# Patient Record
Sex: Female | Born: 1965 | Race: Black or African American | Hispanic: Yes | Marital: Single | Smoking: Never smoker
Health system: Southern US, Community
[De-identification: ages and names within clinical notes are randomized; demographics above are authoritative.]

## PROBLEM LIST (undated history)

## (undated) DIAGNOSIS — T7840XA Allergy, unspecified, initial encounter: Secondary | ICD-10-CM

## (undated) DIAGNOSIS — D649 Anemia, unspecified: Secondary | ICD-10-CM

## (undated) DIAGNOSIS — R0600 Dyspnea, unspecified: Secondary | ICD-10-CM

## (undated) DIAGNOSIS — R5383 Other fatigue: Secondary | ICD-10-CM

## (undated) DIAGNOSIS — N83209 Unspecified ovarian cyst, unspecified side: Secondary | ICD-10-CM

## (undated) DIAGNOSIS — I10 Essential (primary) hypertension: Secondary | ICD-10-CM

## (undated) DIAGNOSIS — R079 Chest pain, unspecified: Secondary | ICD-10-CM

## (undated) HISTORY — PX: TUBAL LIGATION: SHX77

## (undated) HISTORY — DX: Anemia, unspecified: D64.9

## (undated) HISTORY — PX: ABLATION: SHX5711

## (undated) HISTORY — DX: Chest pain, unspecified: R07.9

## (undated) HISTORY — DX: Unspecified ovarian cyst, unspecified side: N83.209

## (undated) HISTORY — DX: Other fatigue: R53.83

## (undated) HISTORY — DX: Allergy, unspecified, initial encounter: T78.40XA

## (undated) HISTORY — PX: TONSILLECTOMY: SUR1361

## (undated) HISTORY — DX: Dyspnea, unspecified: R06.00

---

## 2007-02-04 ENCOUNTER — Other Ambulatory Visit: Admission: RE | Admit: 2007-02-04 | Discharge: 2007-02-04 | Payer: Self-pay | Admitting: Obstetrics and Gynecology

## 2007-06-18 ENCOUNTER — Inpatient Hospital Stay (HOSPITAL_COMMUNITY): Admission: AD | Admit: 2007-06-18 | Discharge: 2007-06-18 | Payer: Self-pay | Admitting: Obstetrics and Gynecology

## 2007-11-09 ENCOUNTER — Ambulatory Visit (HOSPITAL_COMMUNITY): Admission: RE | Admit: 2007-11-09 | Discharge: 2007-11-09 | Payer: Self-pay | Admitting: Obstetrics and Gynecology

## 2009-11-15 ENCOUNTER — Emergency Department (HOSPITAL_COMMUNITY): Admission: EM | Admit: 2009-11-15 | Discharge: 2009-11-15 | Payer: Self-pay | Admitting: Emergency Medicine

## 2010-04-17 LAB — DIFFERENTIAL
Basophils Absolute: 0 10*3/uL (ref 0.0–0.1)
Basophils Relative: 0 % (ref 0–1)
Eosinophils Absolute: 0.3 10*3/uL (ref 0.0–0.7)
Eosinophils Relative: 2 % (ref 0–5)
Lymphocytes Relative: 10 % — ABNORMAL LOW (ref 12–46)
Lymphs Abs: 1.3 10*3/uL (ref 0.7–4.0)
Monocytes Absolute: 0.5 10*3/uL (ref 0.1–1.0)
Monocytes Relative: 4 % (ref 3–12)
Neutro Abs: 11.7 10*3/uL — ABNORMAL HIGH (ref 1.7–7.7)
Neutrophils Relative %: 84 % — ABNORMAL HIGH (ref 43–77)

## 2010-04-17 LAB — URINALYSIS, ROUTINE W REFLEX MICROSCOPIC
Bilirubin Urine: NEGATIVE
Glucose, UA: 250 mg/dL — AB
Leukocytes, UA: NEGATIVE
Nitrite: NEGATIVE
Protein, ur: NEGATIVE mg/dL
Specific Gravity, Urine: 1.017 (ref 1.005–1.030)
Urobilinogen, UA: 0.2 mg/dL (ref 0.0–1.0)
pH: 7.5 (ref 5.0–8.0)

## 2010-04-17 LAB — CBC
HCT: 39 % (ref 36.0–46.0)
Hemoglobin: 12.7 g/dL (ref 12.0–15.0)
MCH: 23.8 pg — ABNORMAL LOW (ref 26.0–34.0)
MCHC: 32.6 g/dL (ref 30.0–36.0)
MCV: 72.9 fL — ABNORMAL LOW (ref 78.0–100.0)
Platelets: 310 10*3/uL (ref 150–400)
RBC: 5.34 MIL/uL — ABNORMAL HIGH (ref 3.87–5.11)
RDW: 16.7 % — ABNORMAL HIGH (ref 11.5–15.5)
WBC: 13.9 10*3/uL — ABNORMAL HIGH (ref 4.0–10.5)

## 2010-04-17 LAB — GC/CHLAMYDIA PROBE AMP, GENITAL
Chlamydia, DNA Probe: NEGATIVE
GC Probe Amp, Genital: NEGATIVE

## 2010-04-17 LAB — WET PREP, GENITAL
Clue Cells Wet Prep HPF POC: NONE SEEN
Trich, Wet Prep: NONE SEEN
WBC, Wet Prep HPF POC: NONE SEEN
Yeast Wet Prep HPF POC: NONE SEEN

## 2010-04-17 LAB — URINE MICROSCOPIC-ADD ON

## 2010-04-17 LAB — PREGNANCY, URINE: Preg Test, Ur: NEGATIVE
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
## 2010-06-17 NOTE — Op Note (Signed)
Robin Delacruz, Robin Delacruz          ACCOUNT NO.:  [id number]   MEDICAL RECORD NO.:  [id number]          PATIENT TYPE:  AMB   LOCATION:  SDC                           FACILITY:  [facility]   PHYSICIAN:  Gerald Leitz, MD          DATE OF BIRTH:  [birth year]   DATE OF PROCEDURE:  11/09/2007  DATE OF DISCHARGE:                               OPERATIVE REPORT   PREOPERATIVE DIAGNOSES:  1. Menorrhagia.  2. Irregular menses.  3. Desires permanent sterilization.   POSTOPERATIVE DIAGNOSES:  1. Menorrhagia.  2. Irregular menses.  3. Desires permanent sterilization.   PROCEDURES:  NovaSure ablation and laparoscopic bilateral tubal ligation  with Filshie clips.   SURGEON:  Gerald Leitz, MD   ASSISTANT:  None.   ESTIMATED BLOOD LOSS:  Minimal.   SPECIMEN:  None.   FINDINGS:  Adhesions of the omentum to the anterior abdominal wall.   FLUIDS:  Per anesthesia.   PROCEDURE:  The patient was taken to the operating room where she was  placed under general anesthesia.  She was placed in the dorsal lithotomy  position, prepped and draped in the usual sterile fashion.  Speculum was  placed in the vaginal vault.  The anterior lip of cervix was grasped  with a single-tooth tenaculum.  The uterus was then sounded to 8.5 cm.  Endocervical canal length was 4.5 cm.  The wound cervix was dilated to 8  mm and the NovaSure apparatus was set at the cavity length of 4 cm.  It  was advanced into the endometrial canal and the array was deployed.  Cavity width was 2.7 cm.  The array of NovaSure device was seated.  CO2  integrity test was performed and this was successfully passed.  Ablation  was performed at a power of 59 W at its full time of 117 seconds.  The  array was allowed to cool.  It was then withdrawn from the endometrial  cavity.  The uterine manipulator was then placed and a single-tooth  tenaculum was removed.  Sterile gloves were changed and the attention  was then turned to the abdomen where a 5-mm skin  incision was made with  the scalpel and a 5-mm trocar was placed under direct visualization.  The abdomen was insufflated with CO2 gas.  The abdomen and pelvis was  examined with the findings noted above.  The patient had adhesions of  the omentum to the anterior abdominal wall.  I was able to visualize the  uterus by going around the adhesions; however, due to the adhesions,  placement of the second trocar was not performed.  Decision was made to  use the umbilicus as the operative port.  Therefore, the 5-mm trocar and  scope were removed.  The umbilical skin incision was extended to 10 mm  and a 10-mm trocar was then placed under direct visualization.  The  operative scope was inserted and the Filshie clip was placed over the  right fallopian tube.  Proper placement was assured.  This was repeated  on the left fallopian tube.  Placement was verified.  CO2 gas was  released and the laparoscope and trocar were removed under direct  visualization.  The fascia was reapproximated with 0 Vicryl.  The skin  was closed with 4-0 Vicryl.  Sponge, lap, and needle counts were correct  x2.  Marcaine 10 mL was injected at the umbilical site for postoperative  anesthesia and 9 mL of 0.25% Marcaine were injected on the cervix at the  4 and 9 o'clock positions for postoperative anesthesia.  The uterine  manipulator was removed.  The patient tolerated the procedure well and  was taken to recovery room awake and in stable condition.      Gerald Leitz, MD  Electronically Signed     TC/MEDQ  D:  11/09/2007  T:  11/10/2007  Job:  [id number]

## 2010-10-29 LAB — CBC
HCT: 36.6
Hemoglobin: 12.2
MCHC: 33.4
MCV: 74.4 — ABNORMAL LOW
Platelets: 336
RBC: 4.92
RDW: 17.6 — ABNORMAL HIGH
WBC: 8.2

## 2010-10-29 LAB — POCT PREGNANCY, URINE
Operator id: 11741
Preg Test, Ur: NEGATIVE

## 2010-11-03 LAB — URINALYSIS, ROUTINE W REFLEX MICROSCOPIC
Bilirubin Urine: NEGATIVE
Glucose, UA: NEGATIVE
Hgb urine dipstick: NEGATIVE
Ketones, ur: NEGATIVE
Nitrite: NEGATIVE
Protein, ur: NEGATIVE
Specific Gravity, Urine: 1.02
Urobilinogen, UA: 0.2
pH: 6

## 2010-11-03 LAB — GLUCOSE, CAPILLARY
Glucose-Capillary: 139 — ABNORMAL HIGH
Glucose-Capillary: 142 — ABNORMAL HIGH

## 2010-11-03 LAB — BASIC METABOLIC PANEL
BUN: 10
CO2: 28
Calcium: 9.3
Chloride: 103
Creatinine, Ser: 0.9
GFR calc Af Amer: >60
GFR calc non Af Amer: >60
Glucose, Bld: 136 — ABNORMAL HIGH
Potassium: 3.2 — ABNORMAL LOW
Sodium: 137

## 2010-11-03 LAB — CBC
HCT: 36.2
Hemoglobin: 11.4 — ABNORMAL LOW
MCHC: 31.5
MCV: 72.3 — ABNORMAL LOW
Platelets: 371
RBC: 5.01
RDW: 15
WBC: 7.2

## 2010-11-03 LAB — PREGNANCY, URINE: Preg Test, Ur: NEGATIVE

## 2011-03-15 ENCOUNTER — Emergency Department (HOSPITAL_COMMUNITY): Payer: BC Managed Care – PPO

## 2011-03-15 ENCOUNTER — Emergency Department (HOSPITAL_COMMUNITY)
Admission: EM | Admit: 2011-03-15 | Discharge: 2011-03-15 | Disposition: A | Discharge status: Home / Self Care | Payer: BC Managed Care – PPO | Attending: Emergency Medicine | Admitting: Emergency Medicine

## 2011-03-15 ENCOUNTER — Encounter (HOSPITAL_COMMUNITY): Payer: Self-pay | Admitting: *Deleted

## 2011-03-15 DIAGNOSIS — N83209 Unspecified ovarian cyst, unspecified side: Secondary | ICD-10-CM | POA: Insufficient documentation

## 2011-03-15 DIAGNOSIS — R509 Fever, unspecified: Secondary | ICD-10-CM | POA: Insufficient documentation

## 2011-03-15 DIAGNOSIS — R1031 Right lower quadrant pain: Secondary | ICD-10-CM | POA: Insufficient documentation

## 2011-03-15 DIAGNOSIS — R112 Nausea with vomiting, unspecified: Secondary | ICD-10-CM | POA: Insufficient documentation

## 2011-03-15 LAB — URINALYSIS, ROUTINE W REFLEX MICROSCOPIC
Bilirubin Urine: NEGATIVE
Glucose, UA: 500 mg/dL — AB
Ketones, ur: 15 mg/dL — AB
Leukocytes, UA: NEGATIVE
Nitrite: NEGATIVE
Protein, ur: NEGATIVE mg/dL
Specific Gravity, Urine: 1.03 (ref 1.005–1.030)
Urobilinogen, UA: 1 mg/dL (ref 0.0–1.0)
pH: 6.5 (ref 5.0–8.0)

## 2011-03-15 LAB — CBC
HCT: 35.6 % — ABNORMAL LOW (ref 36.0–46.0)
Hemoglobin: 10.7 g/dL — ABNORMAL LOW (ref 12.0–15.0)
MCH: 21.4 pg — ABNORMAL LOW (ref 26.0–34.0)
MCHC: 30.1 g/dL (ref 30.0–36.0)
MCV: 71.3 fL — ABNORMAL LOW (ref 78.0–100.0)
Platelets: 274 10*3/uL (ref 150–400)
RBC: 4.99 MIL/uL (ref 3.87–5.11)
RDW: 16 % — ABNORMAL HIGH (ref 11.5–15.5)
WBC: 10.7 10*3/uL — ABNORMAL HIGH (ref 4.0–10.5)

## 2011-03-15 LAB — URINE MICROSCOPIC-ADD ON

## 2011-03-15 LAB — BASIC METABOLIC PANEL
BUN: 17 mg/dL (ref 6–23)
CO2: 26 mEq/L (ref 19–32)
Calcium: 9.1 mg/dL (ref 8.4–10.5)
Chloride: 103 mEq/L (ref 96–112)
Creatinine, Ser: 0.83 mg/dL (ref 0.50–1.10)
GFR calc Af Amer: >90 mL/min (ref >90)
GFR calc non Af Amer: 83 mL/min — ABNORMAL LOW (ref >90)
Glucose, Bld: 224 mg/dL — ABNORMAL HIGH (ref 70–99)
Potassium: 4 mEq/L (ref 3.5–5.1)
Sodium: 138 mEq/L (ref 135–145)

## 2011-03-15 LAB — LIPASE, BLOOD: Lipase: 21 U/L (ref 11–59)

## 2011-03-15 LAB — DIFFERENTIAL
Basophils Absolute: 0 10*3/uL (ref 0.0–0.1)
Basophils Relative: 0 % (ref 0–1)
Eosinophils Absolute: 0.1 10*3/uL (ref 0.0–0.7)
Eosinophils Relative: 1 % (ref 0–5)
Lymphocytes Relative: 11 % — ABNORMAL LOW (ref 12–46)
Lymphs Abs: 1.2 10*3/uL (ref 0.7–4.0)
Monocytes Absolute: 0.5 10*3/uL (ref 0.1–1.0)
Monocytes Relative: 5 % (ref 3–12)
Neutro Abs: 9 10*3/uL — ABNORMAL HIGH (ref 1.7–7.7)
Neutrophils Relative %: 84 % — ABNORMAL HIGH (ref 43–77)

## 2011-03-15 LAB — PREGNANCY, URINE: Preg Test, Ur: NEGATIVE

## 2011-03-15 MED ORDER — SODIUM CHLORIDE 0.9 % IV BOLUS (SEPSIS)
500.0000 mL | Freq: Once | INTRAVENOUS | Status: AC
  Administered 2011-03-15: 500 mL via INTRAVENOUS

## 2011-03-15 MED ORDER — MORPHINE SULFATE 4 MG/ML IJ SOLN
4.0000 mg | Freq: Once | INTRAMUSCULAR | Status: AC
  Administered 2011-03-15: 4 mg via INTRAVENOUS
  Filled 2011-03-15: qty 1

## 2011-03-15 MED ORDER — ONDANSETRON HCL 4 MG/2ML IJ SOLN
4.0000 mg | Freq: Once | INTRAMUSCULAR | Status: AC
  Administered 2011-03-15: 4 mg via INTRAVENOUS
  Filled 2011-03-15: qty 2

## 2011-03-15 MED ORDER — ONDANSETRON 8 MG PO TBDP: 8.0000 mg | ORAL_TABLET | Freq: Three times a day (TID) | ORAL | Status: AC | PRN

## 2011-03-15 MED ORDER — IOHEXOL 300 MG/ML  SOLN: 80.0000 mL | Freq: Once | INTRAMUSCULAR | Status: DC | PRN

## 2011-03-15 MED ORDER — IOHEXOL 300 MG/ML  SOLN: 20.0000 mL | INTRAMUSCULAR | Status: AC

## 2011-03-15 MED ORDER — SODIUM CHLORIDE 0.9 % IV SOLN
Freq: Once | INTRAVENOUS | Status: AC
  Administered 2011-03-15: 12:00:00 via INTRAVENOUS

## 2011-03-15 NOTE — ED Notes (Signed)
Notified Langley Adie, PA, of pt c/o pain - ordering Morphine 4mg  and Zofran 4mg  IVP.

## 2011-03-15 NOTE — ED Provider Notes (Signed)
History     CSN: [id number]  Arrival date & time 03/15/11  0805   First MD Initiated Contact with Patient 03/15/11 [id number]      Chief Complaint  Patient presents with  . Abdominal Pain  . Emesis  . Fever    (Consider location/radiation/quality/duration/timing/severity/associated sxs/prior treatment) HPI Comments: Patient reports that she began having generalized abdominal pain and nausea/vomiting last evening.  She reports that she has had approximately 6 episodes of vomiting.  Denies hematemesis. She reports that she has never had abdominal pain like this pain before.  She reports regular bowel movements.  Last bowel movement yesterday.  Denies diarrhea.  No blood in stool.  She reports that she did have a fever last week, but no fever since 2 days ago.  She reports that she had body aches, dry cough, sore throat, and congestion last week, which has since resolved.  LMP 03/13/11.  She reports that she has had sick contacts at work that have had vomiting.  Past surgical history significant for partial hysterectomy.    The history is provided by the patient.    History reviewed. No pertinent past medical history.  History reviewed. No pertinent past surgical history.  History reviewed. No pertinent family history.  History  Substance Use Topics  . Smoking status: Not on file  . Smokeless tobacco: Not on file  . Alcohol Use: No    OB History    Grav Para Term Preterm Abortions TAB SAB Ect Mult Living                  Review of Systems  Constitutional: Negative for fever, chills and diaphoresis.  HENT: Negative for neck pain and neck stiffness.   Respiratory: Negative for shortness of breath.   Cardiovascular: Negative for chest pain.  Gastrointestinal: Positive for nausea, vomiting and abdominal pain. Negative for diarrhea, constipation and blood in stool.  Genitourinary: Negative for dysuria, frequency, hematuria, flank pain, vaginal bleeding, vaginal discharge, vaginal pain,  pelvic pain and dyspareunia.  Skin: Negative for rash.  Neurological: Negative for dizziness, syncope, weakness and light-headedness.    Allergies  Penicillins and Sulfa antibiotics  Home Medications   Current Outpatient Rx  Name Route Sig Dispense Refill  . BENAZEPRIL HCL 20 MG PO TABS Oral Take 20 mg by mouth every morning.    Marland Kitchen PIOGLITAZONE HCL-METFORMIN HCL 15-500 MG PO TABS Oral Take 1 tablet by mouth every morning.      BP 152/88  Pulse 88  Temp(Src) 98 F (36.7 C) (Oral)  Resp 20  SpO2 98%  LMP 03/15/2011  Physical Exam  Nursing note and vitals reviewed. Constitutional: She is oriented to person, place, and time. She appears well-developed and well-nourished. No distress.  HENT:  Head: Normocephalic and atraumatic.  Neck: Normal range of motion. Neck supple.  Cardiovascular: Normal rate, regular rhythm and normal heart sounds.   Pulmonary/Chest: Effort normal and breath sounds normal.  Abdominal: Soft. Bowel sounds are normal. She exhibits no mass. There is tenderness in the right lower quadrant. There is guarding and tenderness at McBurney's point. There is no rigidity, no rebound and no CVA tenderness.  Musculoskeletal: Normal range of motion.  Neurological: She is alert and oriented to person, place, and time.  Skin: Skin is warm and dry. She is not diaphoretic.  Psychiatric: She has a normal mood and affect.    ED Course  Procedures (including critical care time)  Labs Reviewed - No data to display No results found.  No diagnosis found.  10:49 AM Reassessed patient.  She reports that her pain is tolerable at this time.  12:00 PM Reassessed patient.  She reports that her pain is worsening and is requesting more pain medication at this time.  12:36 PM Reassessed patient.  She reports that her pain has improved.  No vomiting while in the ED. MDM  Patient RLQ abdominal pain along with nausea and vomiting.  CT ab/pelvis showing right ovarian cyst.    Normal appendix visualized.  Pain controlled with pain medication and nausea controlled with Zofran.  VSS.  Afebrile.  Therefore, feel that patient can be discharged home with follow up with GYN.  Patient in agreement with plan.  Patient reports that she does not want any Rx pain medications.  Patient given Rx for Zofran.        Pascal Lux [place], PA-C 03/15/11 1256

## 2011-03-15 NOTE — ED Provider Notes (Signed)
Medical screening examination/treatment/procedure(s) were conducted as a shared visit with non-physician practitioner(s) and myself.  I personally evaluated the patient during the encounter  Right lower quadrant abdominal pain with associated nausea. No vomiting. Developed overnight. His had flulike symptoms for the past few days.  Right lower quadrant abdominal tenderness. The remainder of her abdomen is benign. Regular rate and rhythm.  Labs, urine, CT abdomen pelvis. There is no surgical findings will be discharged home with pain medication she denies vaginal symptoms. There is no indication for a pelvic examination  Dayton Bailiff, MD 03/15/11 1303

## 2011-03-15 NOTE — ED Notes (Signed)
Pt.  Reports a 5 day hx. Of fever and started yesterday with n/v and abdominal cramping.  Pt. Has c/o nasal and chest congestion.  Pt. Has a c/o scratchy throat.

## 2011-03-15 NOTE — ED Notes (Signed)
Pt reports being home since Tuesday with flu like symptoms, n/v/fever/chills. Reports that she started to feel better on Friday and then Friday night she started having abd cramping and n/v. Pt also started her period on Friday. Pt reports frequent vomiting since then and last time she vomited was this am prior to coming to the ed. Pt reports that pain was 10/10 and since receiving morphine pain has decreased to 6/10.

## 2011-03-17 ENCOUNTER — Telehealth: Payer: Self-pay

## 2011-03-17 NOTE — Telephone Encounter (Signed)
Pt only seen in Epic no Chart available

## 2011-03-17 NOTE — Telephone Encounter (Signed)
.  [facility] PT WANTED DR [name] TO KNOW SHE HAD TO GO TO THE HOSPITAL TO HAVE A CT SCAN DONE AND THEY TOOK HER OFF ALL HER MEDS FOR 48HRS WOULD LIKE TO KNOW WHEN CAN SHE START BACK TAKING HER MEDICINE. PLEASE CALL [phone]

## 2011-03-19 NOTE — Telephone Encounter (Signed)
Pt CB and reports you had sent her to hosp for CT to r/o appendicitis/gallbladder and had CT with contrast on Sunday. She was told her kidney tests looked very good at the time, but that she should recheck with Dr [name] to make sure he didn't want her to repeat CMET before starting back on Metformin, or OK to restart after 48 hrs?

## 2011-03-19 NOTE — Telephone Encounter (Signed)
Left message on machine to call back  

## 2011-03-19 NOTE — Telephone Encounter (Signed)
Ok to restart metformin as by chart - had CT 4 days ago.

## 2011-03-19 NOTE — Telephone Encounter (Signed)
Call pt - this would be at the discretion of the physicians who took her off the medicines.  If they said hold meds for 48 hrs only, then could restart as instructed.

## 2011-03-20 NOTE — Telephone Encounter (Signed)
LMOM that OK to restart meds per Dr [name]. Asked for CB with any further questions

## 2011-04-02 ENCOUNTER — Encounter (INDEPENDENT_AMBULATORY_CARE_PROVIDER_SITE_OTHER): Payer: BC Managed Care – PPO | Admitting: Obstetrics and Gynecology

## 2011-04-02 DIAGNOSIS — Z01419 Encounter for gynecological examination (general) (routine) without abnormal findings: Secondary | ICD-10-CM

## 2011-04-02 DIAGNOSIS — N920 Excessive and frequent menstruation with regular cycle: Secondary | ICD-10-CM

## 2011-04-02 DIAGNOSIS — N83209 Unspecified ovarian cyst, unspecified side: Secondary | ICD-10-CM

## 2011-04-22 ENCOUNTER — Encounter (INDEPENDENT_AMBULATORY_CARE_PROVIDER_SITE_OTHER): Payer: BC Managed Care – PPO | Admitting: Obstetrics and Gynecology

## 2011-04-22 DIAGNOSIS — N83209 Unspecified ovarian cyst, unspecified side: Secondary | ICD-10-CM

## 2011-04-22 DIAGNOSIS — E569 Vitamin deficiency, unspecified: Secondary | ICD-10-CM

## 2011-05-03 ENCOUNTER — Other Ambulatory Visit: Payer: Self-pay | Admitting: Family Medicine

## 2011-05-04 ENCOUNTER — Ambulatory Visit: Payer: BC Managed Care – PPO | Admitting: Obstetrics and Gynecology

## 2011-06-21 ENCOUNTER — Ambulatory Visit (INDEPENDENT_AMBULATORY_CARE_PROVIDER_SITE_OTHER): Payer: BC Managed Care – PPO | Admitting: Family Medicine

## 2011-06-21 VITALS — BP 176/99 | HR 75 | Temp 99.2°F | Resp 16 | Ht 65.58 in | Wt 200.4 lb

## 2011-06-21 DIAGNOSIS — M62838 Other muscle spasm: Secondary | ICD-10-CM

## 2011-06-21 DIAGNOSIS — I10 Essential (primary) hypertension: Secondary | ICD-10-CM

## 2011-06-21 DIAGNOSIS — M542 Cervicalgia: Secondary | ICD-10-CM

## 2011-06-21 MED ORDER — BENAZEPRIL HCL 20 MG PO TABS: 20.0000 mg | ORAL_TABLET | Freq: Every day | ORAL | Status: DC

## 2011-06-21 MED ORDER — HYDROCODONE-ACETAMINOPHEN 5-500 MG PO TABS: 1.0000 | ORAL_TABLET | Freq: Three times a day (TID) | ORAL | Status: AC | PRN

## 2011-06-21 MED ORDER — CYCLOBENZAPRINE HCL 10 MG PO TABS: ORAL_TABLET | ORAL | Status: DC
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
## 2011-07-04 DIAGNOSIS — Z0271 Encounter for disability determination: Secondary | ICD-10-CM

## 2011-10-27 ENCOUNTER — Observation Stay (HOSPITAL_COMMUNITY)
Admission: EM | Admit: 2011-10-27 | Discharge: 2011-10-28 | Disposition: A | Discharge status: Home / Self Care | Payer: 59 | Attending: Emergency Medicine | Admitting: Emergency Medicine

## 2011-10-27 ENCOUNTER — Ambulatory Visit (INDEPENDENT_AMBULATORY_CARE_PROVIDER_SITE_OTHER): Payer: 59 | Admitting: Family Medicine

## 2011-10-27 ENCOUNTER — Emergency Department (HOSPITAL_COMMUNITY): Payer: 59

## 2011-10-27 ENCOUNTER — Encounter (HOSPITAL_COMMUNITY): Payer: Self-pay | Admitting: Emergency Medicine

## 2011-10-27 VITALS — BP 169/90 | HR 98 | Temp 97.9°F | Resp 18 | Ht 66.0 in | Wt 208.0 lb

## 2011-10-27 DIAGNOSIS — R61 Generalized hyperhidrosis: Secondary | ICD-10-CM | POA: Insufficient documentation

## 2011-10-27 DIAGNOSIS — R609 Edema, unspecified: Secondary | ICD-10-CM | POA: Insufficient documentation

## 2011-10-27 DIAGNOSIS — I10 Essential (primary) hypertension: Secondary | ICD-10-CM | POA: Insufficient documentation

## 2011-10-27 DIAGNOSIS — R079 Chest pain, unspecified: Principal | ICD-10-CM | POA: Insufficient documentation

## 2011-10-27 DIAGNOSIS — R0600 Dyspnea, unspecified: Secondary | ICD-10-CM

## 2011-10-27 DIAGNOSIS — E119 Type 2 diabetes mellitus without complications: Secondary | ICD-10-CM | POA: Insufficient documentation

## 2011-10-27 DIAGNOSIS — R42 Dizziness and giddiness: Secondary | ICD-10-CM

## 2011-10-27 DIAGNOSIS — R0989 Other specified symptoms and signs involving the circulatory and respiratory systems: Secondary | ICD-10-CM

## 2011-10-27 DIAGNOSIS — Z111 Encounter for screening for respiratory tuberculosis: Secondary | ICD-10-CM

## 2011-10-27 DIAGNOSIS — R0609 Other forms of dyspnea: Secondary | ICD-10-CM

## 2011-10-27 DIAGNOSIS — I252 Old myocardial infarction: Secondary | ICD-10-CM | POA: Insufficient documentation

## 2011-10-27 DIAGNOSIS — R110 Nausea: Secondary | ICD-10-CM | POA: Insufficient documentation

## 2011-10-27 HISTORY — DX: Essential (primary) hypertension: I10

## 2011-10-27 LAB — CBC WITH DIFFERENTIAL/PLATELET
Basophils Absolute: 0 10*3/uL (ref 0.0–0.1)
Basophils Relative: 0 % (ref 0–1)
Eosinophils Absolute: 0.2 10*3/uL (ref 0.0–0.7)
Eosinophils Relative: 3 % (ref 0–5)
HCT: 31.6 % — ABNORMAL LOW (ref 36.0–46.0)
Hemoglobin: 9.7 g/dL — ABNORMAL LOW (ref 12.0–15.0)
Lymphocytes Relative: 33 % (ref 12–46)
Lymphs Abs: 2.5 10*3/uL (ref 0.7–4.0)
MCH: 21.8 pg — ABNORMAL LOW (ref 26.0–34.0)
MCHC: 30.7 g/dL (ref 30.0–36.0)
MCV: 71.2 fL — ABNORMAL LOW (ref 78.0–100.0)
Monocytes Absolute: 0.5 10*3/uL (ref 0.1–1.0)
Monocytes Relative: 7 % (ref 3–12)
Neutro Abs: 4.3 10*3/uL (ref 1.7–7.7)
Neutrophils Relative %: 57 % (ref 43–77)
Platelets: 257 10*3/uL (ref 150–400)
RBC: 4.44 MIL/uL (ref 3.87–5.11)
RDW: 14.7 % (ref 11.5–15.5)
WBC: 7.5 10*3/uL (ref 4.0–10.5)

## 2011-10-27 LAB — COMPREHENSIVE METABOLIC PANEL
ALT: 11 U/L (ref 0–35)
AST: 16 U/L (ref 0–37)
Albumin: 3.2 g/dL — ABNORMAL LOW (ref 3.5–5.2)
Alkaline Phosphatase: 79 U/L (ref 39–117)
BUN: 17 mg/dL (ref 6–23)
CO2: 28 mEq/L (ref 19–32)
Calcium: 9.2 mg/dL (ref 8.4–10.5)
Chloride: 106 mEq/L (ref 96–112)
Creatinine, Ser: 0.94 mg/dL (ref 0.50–1.10)
GFR calc Af Amer: 82 mL/min — ABNORMAL LOW (ref >90)
GFR calc non Af Amer: 71 mL/min — ABNORMAL LOW (ref >90)
Glucose, Bld: 128 mg/dL — ABNORMAL HIGH (ref 70–99)
Potassium: 3.7 mEq/L (ref 3.5–5.1)
Sodium: 142 mEq/L (ref 135–145)
Total Bilirubin: 0.1 mg/dL — ABNORMAL LOW (ref 0.3–1.2)
Total Protein: 6.8 g/dL (ref 6.0–8.3)

## 2011-10-27 LAB — URINALYSIS, ROUTINE W REFLEX MICROSCOPIC
Bilirubin Urine: NEGATIVE
Glucose, UA: NEGATIVE mg/dL
Hgb urine dipstick: NEGATIVE
Ketones, ur: NEGATIVE mg/dL
Leukocytes, UA: NEGATIVE
Nitrite: NEGATIVE
Protein, ur: NEGATIVE mg/dL
Specific Gravity, Urine: 1.013 (ref 1.005–1.030)
Urobilinogen, UA: 0.2 mg/dL (ref 0.0–1.0)
pH: 7.5 (ref 5.0–8.0)

## 2011-10-27 LAB — GLUCOSE, CAPILLARY: Glucose-Capillary: 131 mg/dL — ABNORMAL HIGH (ref 70–99)

## 2011-10-27 LAB — TROPONIN I: Troponin I: <0.3 ng/mL (ref <0.30)

## 2011-10-27 LAB — POCT PREGNANCY, URINE: Preg Test, Ur: NEGATIVE

## 2011-10-27 MED ORDER — SODIUM CHLORIDE 0.9 % IV SOLN
Freq: Once | INTRAVENOUS | Status: AC
  Administered 2011-10-27: 18:00:00 via INTRAVENOUS

## 2011-10-27 MED ORDER — SODIUM CHLORIDE 0.9 % IV SOLN: 1000.0000 mL | INTRAVENOUS | Status: DC

## 2011-10-27 NOTE — ED Notes (Addendum)
Per EMS pt was transferred from Urgent Care on [address];  Pt went to be seen for chest pain/pressure, SOB, and nausea that began at 1530 today.  Pt was given 2 Nitro and 324mg  of Aspirin prior to arrival, currently pain free.

## 2011-10-27 NOTE — ED Notes (Signed)
Upon palpation and deep breaths chest pain reoccurs; reports history of dry nonproductive cough since yesterday; denies fever; no pedal edema noted upon assessment

## 2011-10-27 NOTE — ED Notes (Addendum)
States she had right chest pressure, diaphoretic, dizziness, and coughing this morning. Went to PCP who transferred patient to [facility]. 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 Denies pain at this time

## 2011-10-27 NOTE — ED Notes (Signed)
Checked patient cbg it was 131 notified PA [address]

## 2011-10-27 NOTE — Progress Notes (Signed)
  Subjective:    Patient ID: Robin Delacruz, female    DOB: 16-Mar-1965, 46 y.o.   MRN: [id number]  HPI Mickey Hebel is a 47 y.o. female  Lightheaded, shortness of breath and pressure in chest at 9am today.  Works at [employer].  Was doing pat downs,  Has had some of chest pressure, tightness 8/10, felt like something sitting on her, diaphoretic, lightheaded, chills, dyspnea.  BP 171/101 this morning when had symptoms.  layed down for relief over 30 minutes.   Recheck BP by nurse at work 125/88. Had some aches in chest still then. Helped some with rest but still with slight pressure/tightness 3/10 currently. Feels some dyspnea currently.  No radiation of pain. No nausea.vomiting.  No recent respiratory illness. No hx of chest pain.  Stress test about 15 years ago. No known twisting turning injury known. No heartburn or belching.  Unable to reproduce pain with pressure earlier. No hx of asthma. No recent prolonged car travel or air travel, no recent calf pain or swelling.  Did have pain in upper thighs to both legs this morning when having chest pain.  Not now.   No recent missed doses of meds SH:  Nonsmoker.  NO IDU.  No alcohol.    FH: mom htn, no MI/CVA. dad: DM, HTN, MI in early 38's, and again at 75.     Review of Systems     Objective:   Physical Exam  Constitutional: She is oriented to person, place, and time. She appears well-developed and well-nourished. No distress.  HENT:  Head: Normocephalic and atraumatic.  Eyes: Conjunctivae normal and EOM are normal. Pupils are equal, round, and reactive to light.  Neck: Carotid bruit is not present.  Cardiovascular: Normal rate, regular rhythm, normal heart sounds and intact distal pulses.   Pulmonary/Chest: Effort normal and breath sounds normal. She exhibits tenderness. She exhibits no crepitus and no deformity.    Abdominal: Soft. She exhibits no pulsatile midline mass. There is no tenderness.  Neurological: She is  alert and oriented to person, place, and time.  Skin: Skin is warm and dry. She is not diaphoretic.  Psychiatric: She has a normal mood and affect. Her behavior is normal.   EKG: sr, low voltage in lead III only, but no apparent acute findings otherwise.      Assessment & Plan:  Audie Wieser is a 46 y.o. female 1. Chest pain  DG Chest 2 View, EKG 12-Lead  2. Dyspnea    3. Diaphoresis    4. HTN (hypertension)    5. DM type 2 (diabetes mellitus, type 2)     Hx of HTN, DM2 with onset of chest pain with activity this morning, diaphoresis and dyspnea.  Persistent "not well" feeling all day, with persistent 3/10 chest tightness.  No acute findings on EKG, but risk factors of DM, HTN , and FH CAD, with concerning hx for acute coronary syndrome or unstable angina.  O2 [employer] at 2 liters, aspirin 324mg  chewable at 1705, placed on monitor, and ems called for transport.  IV placed L hand - 24 gauge - NS at [facility]  BP 173/85 at 1715, pain level 3/10.  - nitroglycerin 0.4mg  sl given at 1715. Repeat BP: 170/98 at 1717.   Increase in chest pain/tightness, felt like this am.  Headache.  SR on monitorRepeat BP: 162/95 at 1722. Transfer of care to EMS at 1722. Nurse advised at [facility] ER,

## 2011-10-27 NOTE — ED Provider Notes (Signed)
History     CSN: [id number]  Arrival date & time 10/27/11  4098   First MD Initiated Contact with Patient 10/27/11 1813      Chief Complaint  Patient presents with  . Chest Pain    (Consider location/radiation/quality/duration/timing/severity/associated sxs/prior treatment) Patient is a 46 y.o. female presenting with chest pain. The history is provided by the patient and medical records.  Chest Pain Primary symptoms include nausea. Pertinent negatives for primary symptoms include no fever, no fatigue, no shortness of breath, no cough, no wheezing, no abdominal pain and no vomiting.  Associated symptoms include diaphoresis.     Tinie Mcgloin is a 46 y.o. female presents to the emergency department complaining of chest pain.  The onset of the symptoms was  abrupt starting 8 hours ago.  The patient has associated nausea, shortness of breath, diaphoresis.  The symptoms have been  intermittent, completely resolved.  Nothing makes the symptoms worse and nothing makes symptoms better.  The patient denies fever, chills, headache, neck pain, back pain, abdominal pain, vomiting, diarrhea, weakness, syncope, near syncope.  Patient states she was at work this morning when she had a sudden onset episode of chest pressure associated with nausea diaphoresis and shortness of breath. She states she ignored feeling and rested for short time after which is feeling subsided.  Nothing in the feeling returned with all the associated symptoms. She presented to her family doctor with the complaint of chest pain. On arrival the family physician they sent her here. Patient with a history of type 2 diabetes and hypertension which is controlled with by mouth medications. Patient states she's been taking medications as prescribed.  She denies any history of similar episodes. Personal history of MI at age 46 with neg stress echo at that time.  Family history of MI, CAD in her father.  Patient transported from primary  care office to the hospital via EMS.  Patient was given 324 mg of aspirin and 2 nitroglycerin. Patient states symptoms have resolved at this time. Pt reports mild edema throughout the day in bilateral lower extremities as normal.     Past Medical History  Diagnosis Date  . Hypertension   . Diabetes mellitus     Past Surgical History  Procedure Date  . Cesarean section     No family history on file.  History  Substance Use Topics  . Smoking status: Never Smoker   . Smokeless tobacco: Not on file  . Alcohol Use: No    OB History    Grav Para Term Preterm Abortions TAB SAB Ect Mult Living                  Review of Systems  Constitutional: Positive for diaphoresis. Negative for fever, appetite change, fatigue and unexpected weight change.  HENT: Negative for mouth sores and neck stiffness.   Eyes: Negative for visual disturbance.  Respiratory: Negative for cough, chest tightness, shortness of breath and wheezing.   Cardiovascular: Positive for chest pain.  Gastrointestinal: Positive for nausea. Negative for vomiting, abdominal pain, diarrhea and constipation.  Genitourinary: Negative for dysuria, urgency, frequency and hematuria.  Skin: Negative for rash.  Neurological: Negative for syncope, light-headedness and headaches.  Psychiatric/Behavioral: Negative for disturbed wake/sleep cycle. The patient is not nervous/anxious.     Allergies  Penicillins and Sulfa antibiotics  Home Medications   Current Outpatient Rx  Name Route Sig Dispense Refill  . ASPIRIN 81 MG PO CHEW Oral Chew 243 mg by mouth once.     Marland Kitchen  BENAZEPRIL HCL 20 MG PO TABS Oral Take 20 mg by mouth every morning.    Marland Kitchen PIOGLITAZONE HCL-METFORMIN HCL 15-500 MG PO TABS Oral Take 1 tablet by mouth every morning.    Marland Kitchen NITROGLYCERIN 0.4 MG SL SUBL Sublingual Place 0.4 mg under the tongue every 5 (five) minutes as needed. For chest pain      BP 144/87  Pulse 65  Temp 98.1 F (36.7 C) (Oral)  Resp 18  SpO2  100%  LMP 10/13/2011  Physical Exam  Nursing note and vitals reviewed. Constitutional: She appears well-developed and well-nourished. No distress.  HENT:  Head: Normocephalic and atraumatic.  Mouth/Throat: Oropharynx is clear and moist. No oropharyngeal exudate.  Eyes: Conjunctivae normal and EOM are normal. Pupils are equal, round, and reactive to light. No scleral icterus.  Neck: Normal range of motion. Neck supple.  Cardiovascular: Normal rate, regular rhythm, normal heart sounds and intact distal pulses.  Exam reveals no gallop and no friction rub.   No murmur heard. Pulmonary/Chest: Effort normal and breath sounds normal. No respiratory distress. She has no wheezes. She has no rales. She exhibits no tenderness.  Abdominal: Soft. Bowel sounds are normal. She exhibits no mass. There is no tenderness. There is no rebound and no guarding.  Musculoskeletal: Normal range of motion. She exhibits edema (mild).  Lymphadenopathy:    She has no cervical adenopathy.  Neurological: She is alert. She exhibits normal muscle tone. Coordination normal.       Speech is clear and goal oriented Moves extremities without ataxia  Skin: Skin is warm and dry. No rash noted. She is not diaphoretic.  Psychiatric: She has a normal mood and affect.    ED Course  Procedures (including critical care time)  Labs Reviewed  CBC WITH DIFFERENTIAL - Abnormal; Notable for the following:    Hemoglobin 9.7 (*)     HCT 31.6 (*)     MCV 71.2 (*)     MCH 21.8 (*)     All other components within normal limits  COMPREHENSIVE METABOLIC PANEL - Abnormal; Notable for the following:    Glucose, Bld 128 (*)     Albumin 3.2 (*)     Total Bilirubin 0.1 (*)     GFR calc non Af Amer 71 (*)     GFR calc Af Amer 82 (*)     All other components within normal limits  GLUCOSE, CAPILLARY - Abnormal; Notable for the following:    Glucose-Capillary 131 (*)     All other components within normal limits  URINALYSIS, ROUTINE W  REFLEX MICROSCOPIC - Abnormal; Notable for the following:    APPearance CLOUDY (*)     All other components within normal limits  POCT PREGNANCY, URINE  TROPONIN I   Dg Chest 2 View  10/27/2011  *RADIOLOGY REPORT*  Clinical Data: Recurrent chest pain  CHEST - 2 VIEW  Comparison: None.  Findings: Lungs are essentially clear.  Very mild/vague opacity in the right upper lobe is favored to reflect normal pulmonary markings.  No pleural effusion or pneumothorax.  The heart is top normal in size.  Mild degenerative changes of the visualized thoracolumbar spine.  IMPRESSION: No evidence of acute cardiopulmonary disease.   Original Report Authenticated By: Charline Bills, M.D.      Results for orders placed during the hospital encounter of 10/27/11  CBC WITH DIFFERENTIAL      Component Value Range   WBC 7.5  4.0 - 10.5 K/uL   RBC 4.44  3.87 - 5.11 MIL/uL   Hemoglobin 9.7 (*) 12.0 - 15.0 g/dL   HCT 40.9 (*) 81.1 - 91.4 %   MCV 71.2 (*) 78.0 - 100.0 fL   MCH 21.8 (*) 26.0 - 34.0 pg   MCHC 30.7  30.0 - 36.0 g/dL   RDW 78.2  95.6 - 21.3 %   Platelets 257  150 - 400 K/uL   Neutrophils Relative 57  43 - 77 %   Lymphocytes Relative 33  12 - 46 %   Monocytes Relative 7  3 - 12 %   Eosinophils Relative 3  0 - 5 %   Basophils Relative 0  0 - 1 %   Neutro Abs 4.3  1.7 - 7.7 K/uL   Lymphs Abs 2.5  0.7 - 4.0 K/uL   Monocytes Absolute 0.5  0.1 - 1.0 K/uL   Eosinophils Absolute 0.2  0.0 - 0.7 K/uL   Basophils Absolute 0.0  0.0 - 0.1 K/uL   RBC Morphology POLYCHROMASIA PRESENT    COMPREHENSIVE METABOLIC PANEL      Component Value Range   Sodium 142  135 - 145 mEq/L   Potassium 3.7  3.5 - 5.1 mEq/L   Chloride 106  96 - 112 mEq/L   CO2 28  19 - 32 mEq/L   Glucose, Bld 128 (*) 70 - 99 mg/dL   BUN 17  6 - 23 mg/dL   Creatinine, Ser 0.86  0.50 - 1.10 mg/dL   Calcium 9.2  8.4 - 57.8 mg/dL   Total Protein 6.8  6.0 - 8.3 g/dL   Albumin 3.2 (*) 3.5 - 5.2 g/dL   AST 16  0 - 37 U/L   ALT 11  0 - 35 U/L    Alkaline Phosphatase 79  39 - 117 U/L   Total Bilirubin 0.1 (*) 0.3 - 1.2 mg/dL   GFR calc non Af Amer 71 (*) >90 mL/min   GFR calc Af Amer 82 (*) >90 mL/min  GLUCOSE, CAPILLARY      Component Value Range   Glucose-Capillary 131 (*) 70 - 99 mg/dL  URINALYSIS, ROUTINE W REFLEX MICROSCOPIC      Component Value Range   Color, Urine YELLOW  YELLOW   APPearance CLOUDY (*) CLEAR   Specific Gravity, Urine 1.013  1.005 - 1.030   pH 7.5  5.0 - 8.0   Glucose, UA NEGATIVE  NEGATIVE mg/dL   Hgb urine dipstick NEGATIVE  NEGATIVE   Bilirubin Urine NEGATIVE  NEGATIVE   Ketones, ur NEGATIVE  NEGATIVE mg/dL   Protein, ur NEGATIVE  NEGATIVE mg/dL   Urobilinogen, UA 0.2  0.0 - 1.0 mg/dL   Nitrite NEGATIVE  NEGATIVE   Leukocytes, UA NEGATIVE  NEGATIVE  POCT PREGNANCY, URINE      Component Value Range   Preg Test, Ur NEGATIVE  NEGATIVE  TROPONIN I      Component Value Range   Troponin I <0.30  <0.30 ng/mL   Dg Chest 2 View  10/27/2011  *RADIOLOGY REPORT*  Clinical Data: Recurrent chest pain  CHEST - 2 VIEW  Comparison: None.  Findings: Lungs are essentially clear.  Very mild/vague opacity in the right upper lobe is favored to reflect normal pulmonary markings.  No pleural effusion or pneumothorax.  The heart is top normal in size.  Mild degenerative changes of the visualized thoracolumbar spine.  IMPRESSION: No evidence of acute cardiopulmonary disease.   Original Report Authenticated By: Charline Bills, M.D.    ECG:  Date: 10/27/2011  Rate: 64  Rhythm: normal sinus rhythm  QRS Axis: normal  Intervals: normal  ST/T Wave abnormalities: normal and nonspecific T wave changes  Conduction Disutrbances:PACs  Narrative Interpretation: Sinus rhythm with PACs and t wave flattening in V6  Old EKG Reviewed: none available    1. Chest pain   2. Nausea   3. Lightheadedness       MDM  Lynetta Mare presents for chest pain, nausea and diaphoresis.  Patient with symptoms concerning for ACS.  CBC with mild anemia Hbg 9.7, urinalysis without evidence of UTI, urine pregnancy test negative, CMP unremarkable, glucose 131.  Chest x-ray evidence of acute cardiopulmonary disease.  ECG with PACs mild T wave flattening in V6 without ischemic evidence.  Troponin negative. The patient moved to CDU, chest pain protocol. Patient discussed with Felicie Morn, NP.  Patient scheduled for stress echo in the morning.       Dahlia Client Eldene Plocher, PA-C 10/28/11 0122

## 2011-10-27 NOTE — ED Provider Notes (Signed)
Patient placed on chest pain protocol.  Episode of chest pressure, dizziness, diaphoresis earlier today.  Currently symptom free except feeling "washed-out".  Resting comfortably at present with family at bedside.  Negative troponin.  12 lead reviewed, no indication of ischemia.  Monitor reveals NSR without ectopy.  Lungs CTA bilaterally. S1/ S2, RRR.  Abdomen soft, bowel sounds present.  Strong distal pulses palpated all extremities. Patient scheduled for stress echo in AM.  Diagnostic and treatment plan discussed with patient.  Jimmye Norman, NP 10/27/11 [phone]

## 2011-10-27 NOTE — ED Notes (Signed)
Report given to Lana, RN.

## 2011-10-27 NOTE — ED Notes (Signed)
I-Stat Troponin POCT resulted:   0.00 ng/mL

## 2011-10-28 DIAGNOSIS — R072 Precordial pain: Secondary | ICD-10-CM

## 2011-10-28 LAB — POCT I-STAT TROPONIN I
Troponin i, poc: 0 ng/mL (ref 0.00–0.08)
Troponin i, poc: 0 ng/mL (ref 0.00–0.08)

## 2011-10-28 NOTE — ED Provider Notes (Signed)
7:30 AM Assumed care of patient in CDU. Patient is here on chest pain protocol. Awaiting stress echocardiogram. Presented to the ED yesterday with sudden onset chest pain and shortness of breath. She has a history of diabetes mellitus and hypertension. Chest has a past medical history of previous negative stress echo. Family history of CAD and MI. Patient has no complaints at this time. Lab results were positive for hemoglobin of 9.7. Patient states that she has had the diagnosis of dysfunctional uterine bleeding and recently underwent uterine ablation. States that she still has about 3 days of very heavy bleeding during her period. I spoken with the patient and suggested that she have followup to make sure that her hemoglobin is not dropping any further. She sees Dr. [name] at you at [facility]. CV: RRR, No M/R/G, Peripheral pulses intact. No peripheral edema. Lungs: CTAB Abd: Soft, Non tender, non distended    10:59 AM Filed Vitals:   10/27/11 2230 10/27/11 2300 10/28/11 0700 10/28/11 1029  BP: 170/92 144/87 155/89 168/93  Pulse: 81 65 65 62  Temp:    97.7 F (36.5 C)  TempSrc:    Oral  Resp: 14 18 23 20   SpO2: 100% 100% 98% 100%    Took report from Dr. Willa Rough, who reports that patient's stress echocardiogram is negative. She reports that she did feel some heaviness in chest. Tenderness on the left side during the test. Patient is tender to palpation of the musculoskeletal wall on the left side. 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 She thinks that she may have pulled a muscle. Patient has also been having elevated blood pressures. Her fianc is with her in the room and states that her job is highly demanding and she often has to do work. When she is not actually scheduled including de-escalation of crisis situations with children. Counseled the patient on setting proper boundaries to make sure that her stress levels were in  check. I want her to followup soon with Dr. Chilton Si regarding her continued elevated blood pressures as she is a diabetic. She also needs to followup about her hemoglobin. CV: RRR, No M/R/G, Peripheral pulses intact. No peripheral edema. Lungs: CTAB Abd: Soft, Non tender, non distended  Discussed reasons to seek immediate care. Patient expresses understanding and agrees with plan.   Arthor Captain, PA-C 10/28/11 1102

## 2011-10-28 NOTE — ED Provider Notes (Signed)
Please see our initial notes on this patient's presentation.  She was admitted to the CDU under the chest pain protocol.  Gerhard Munch, MD 10/28/11 0010

## 2011-10-28 NOTE — ED Notes (Signed)
Patient taken to stress via wheelchair with tech.

## 2011-10-28 NOTE — ED Notes (Signed)
TROPONIN RESULTS  cTnl  0.00 ng/mL 

## 2011-10-29 ENCOUNTER — Ambulatory Visit (INDEPENDENT_AMBULATORY_CARE_PROVIDER_SITE_OTHER): Payer: 59 | Admitting: Family Medicine

## 2011-10-29 VITALS — BP 146/88 | HR 80 | Temp 98.3°F | Resp 17 | Ht 66.0 in | Wt 206.0 lb

## 2011-10-29 DIAGNOSIS — R5381 Other malaise: Secondary | ICD-10-CM

## 2011-10-29 DIAGNOSIS — Z111 Encounter for screening for respiratory tuberculosis: Secondary | ICD-10-CM

## 2011-10-29 DIAGNOSIS — R5383 Other fatigue: Secondary | ICD-10-CM

## 2011-10-29 DIAGNOSIS — Z23 Encounter for immunization: Secondary | ICD-10-CM

## 2011-10-29 DIAGNOSIS — F411 Generalized anxiety disorder: Secondary | ICD-10-CM

## 2011-10-29 DIAGNOSIS — R079 Chest pain, unspecified: Secondary | ICD-10-CM

## 2011-10-29 DIAGNOSIS — D649 Anemia, unspecified: Secondary | ICD-10-CM

## 2011-10-29 DIAGNOSIS — R0600 Dyspnea, unspecified: Secondary | ICD-10-CM

## 2011-10-29 DIAGNOSIS — R0602 Shortness of breath: Secondary | ICD-10-CM

## 2011-10-29 LAB — POCT CBC
Granulocyte percent: 56.8 %G (ref 37–80)
HCT, POC: 33.7 % — AB (ref 37.7–47.9)
Hemoglobin: 9.9 g/dL — AB (ref 12.2–16.2)
Lymph, poc: 2.6 (ref 0.6–3.4)
MCH, POC: 21.1 pg — AB (ref 27–31.2)
MCHC: 29.4 g/dL — AB (ref 31.8–35.4)
MCV: 71.8 fL — AB (ref 80–97)
MID (cbc): 0.6 (ref 0–0.9)
MPV: 9.4 fL (ref 0–99.8)
POC Granulocyte: 4.2 (ref 2–6.9)
POC LYMPH PERCENT: 34.8 %L (ref 10–50)
POC MID %: 8.4 %M (ref 0–12)
Platelet Count, POC: 305 10*3/uL (ref 142–424)
RBC: 4.7 M/uL (ref 4.04–5.48)
RDW, POC: 16.3 %
WBC: 7.4 10*3/uL (ref 4.6–10.2)

## 2011-10-29 MED ORDER — FERROUS SULFATE 325 (65 FE) MG PO TABS
325.0000 mg | ORAL_TABLET | Freq: Every day | ORAL | Status: DC
Start: 2011-10-29 — End: 2018-06-17

## 2011-10-29 NOTE — Progress Notes (Signed)
Subjective:    Patient ID: Robin Delacruz, female    DOB: November 18, 1965, 46 y.o.   MRN: [id number]  HPI Robin Delacruz is a 46 y.o. female Here with fiance.   Getting married December 7th.   See ov 2 days ago - chest pain and dyspnea - sent to ER for eval.  Records noted.  Troponin negative.  Followed in CDU, had negative stress echo yesterday am. - Dr. Myrtis Ser. Hgb 9.7 in hospital. Per notes - had the diagnosis of dysfunctional uterine bleeding and recently underwent uterine ablation - 2010.  [facility].  Last seen last month.  No blood drawn. States that she still has about 3 days of very heavy bleeding during her period, starts and stops at times, but better than before.  Not taking iron.  Here for follow up.thought was chest strain caused the pain.  Feels better.  Still fatigued. Some chest pressure earlier today, with slight cough.  Short of breath then as well.  Blood pressure was up 165/98 then.  Usually runs higher in the evenings.  Still feels fatigued.  Stress at work.    Took extra 1/2 dose of lotensin that night - BP in 120's.   Took past 2 days off.  Would like to take some more time off due to these symptoms and stress at work.  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 Last test in June 2012 negative.    Tuberculosis Risk Questionnaire  1. Were you born outside the [place] in one of the following parts of the world:    [place], [place], [place], [place] or [place]?  No  2. Have you traveled outside the [place] and lived for more than one month in one of the following parts of the world:  [place], [place], [place], [place] or [place]?  No  3. Do you have a compromised immune system such as from any of the following conditions:  HIV/AIDS, organ or bone marrow transplantation, diabetes, immunosuppressive   medicines (e.g. Prednisone, Remicaide), leukemia, lymphoma, cancer of the   head or neck, gastrectomy  or jejunal bypass, end-stage renal disease (on   dialysis), or silicosis?  No, except diabetes type 2.    4. Have you ever done one of the following:    Used crack cocaine, injected illegal drugs, worked or resided in jail or prison,   worked or resided at a homeless shelter, or worked as a Research scientist (physical sciences) in   direct contact with patients?  No  5. Have you ever been exposed to anyone with infectious tuberculosis?  No Tuberculosis Symptom Questionnaire  Do you currently have any of the following symptoms?  1. Unexplained cough lasting more than 3 weeks? No  Unexplained fever lasting more than 3 weeks. No   3. Night Sweats (sweating that leaves the bedclothes and sheets wet)   No  4. Shortness of Breath: yes - see recent notes.  5. Chest Pain Yes for past few days.  6. Unintentional weight loss  No  7. Unexplained fatigue (very tired for no reason) No   Review of Systems as above.    Objective:   Physical Exam  Constitutional: She is oriented to person, place, and time. She appears well-developed and well-nourished.  HENT:  Head: Normocephalic and atraumatic.  Eyes: Conjunctivae normal and EOM are normal. Pupils are equal, round, and reactive to light.  Neck: Carotid bruit is not present.  Cardiovascular: Normal rate,  regular rhythm, normal heart sounds and intact distal pulses.   Pulmonary/Chest: Effort normal and breath sounds normal.       ttp L ant chest wall - min ttp.   Abdominal: Soft. She exhibits no pulsatile midline mass. There is no tenderness.  Neurological: She is alert and oriented to person, place, and time.  Skin: Skin is warm and dry.  Psychiatric: She has a normal mood and affect. Her behavior is normal.      Results for orders placed in visit on 10/29/11  POCT CBC      Component Value Range   WBC 7.4  4.6 - 10.2 K/uL   Lymph, poc 2.6  0.6 - 3.4   POC LYMPH PERCENT 34.8  10 - 50 %L   MID (cbc) 0.6  0 - 0.9   POC MID % 8.4  0 - 12 %M   POC  Granulocyte 4.2  2 - 6.9   Granulocyte percent 56.8  37 - 80 %G   RBC 4.70  4.04 - 5.48 M/uL   Hemoglobin 9.9 (*) 12.2 - 16.2 g/dL   HCT, POC 16.1 (*) 09.6 - 47.9 %   MCV 71.8 (*) 80 - 97 fL   MCH, POC 21.1 (*) 27 - 31.2 pg   MCHC 29.4 (*) 31.8 - 35.4 g/dL   RDW, POC 04.5     Platelet Count, POC 305  142 - 424 K/uL   MPV 9.4  0 - 99.8 fL       Assessment & Plan:  Robin Delacruz is a 46 y.o. female 1. Immunization due  Flu vaccine greater than or equal to 3yo preservative free IM  2. Chest pain  Ambulatory referral to Cardiology, TB Skin Test  3. Dyspnea  POCT CBC, Ambulatory referral to Cardiology, TB Skin Test  4. Fatigue  POCT CBC, Ferritin, TB Skin Test  5. Anemia  Ferritin, ferrous sulfate 325 (65 FE) MG tablet, IFOBT POC (occult bld, rslt in office)   Chest pain - improved. Episodic sensation, but negative echo and enzymes in hospital - unlikely cardiac disease, but with hx of DM - will refer to cards for outpatient follow up.   Htn - can take 10mg  benazepril at night for next 2 weeks in addition to am dosing.  recheck in 2 weeks.   Anxiety/stress - likely related to above.  Here with fiance - family and work stressors - stress mgt techniques reviewed.   Anemia - hx of heavy menses, iron deficiency anemia. No dark, tarry stools. Hemosure sent home with patient. Check ferritin, start iron QD, and recheck levels in 6 weeks.   Flu shot given.  TB screening.  Risk assessed as above.  Positive responses - will place ppd, although unlikely to be positive.   Patient Instructions  Start the iron once per day.  Your should receive a call or letter about your lab results within the next week to 10 days.  Recheck iron level in  6 weeks. Recheck for chest symptoms in the next 2 weeks. Return to the clinic or go to the nearest emergency room if any of your symptoms worsen or new symptoms occur. Work on Medical illustrator as we discussed.

## 2011-10-29 NOTE — Patient Instructions (Signed)
Start the iron once per day.  Your should receive a call or letter about your lab results within the next week to 10 days.  Recheck iron level in  6 weeks. Recheck for chest symptoms in the next 2 weeks. Return to the clinic or go to the nearest emergency room if any of your symptoms worsen or new symptoms occur. Work on Medical illustrator as we discussed.

## 2011-10-29 NOTE — ED Provider Notes (Signed)
Medical screening examination/treatment/procedure(s) were conducted as a shared visit with non-physician practitioner(s) and myself.  I personally evaluated the patient during the encounter On my exam this patient was in no distress, resting, seemingly comfortably, with unremarkable vital signs.  She was in no pain.  Given her low risk profile she tries to the CDU under the chest pain protocol.  I saw the ECG, relevant labs and studies - I agree with the interpretation.   Gerhard Munch, MD 10/29/11 [phone]

## 2011-10-30 ENCOUNTER — Encounter: Payer: Self-pay | Admitting: *Deleted

## 2011-10-30 ENCOUNTER — Encounter: Payer: Self-pay | Admitting: Cardiology

## 2011-10-30 DIAGNOSIS — I10 Essential (primary) hypertension: Secondary | ICD-10-CM | POA: Insufficient documentation

## 2011-10-30 LAB — FERRITIN: Ferritin: 2 ng/mL — ABNORMAL LOW (ref 10–291)

## 2011-10-31 NOTE — ED Provider Notes (Signed)
Medical screening examination/treatment/procedure(s) were performed by non-physician practitioner and as supervising physician I was immediately available for consultation/collaboration.  Flint Melter, MD 10/31/11 1124

## 2011-11-01 ENCOUNTER — Ambulatory Visit (INDEPENDENT_AMBULATORY_CARE_PROVIDER_SITE_OTHER): Payer: 59

## 2011-11-01 DIAGNOSIS — Z111 Encounter for screening for respiratory tuberculosis: Secondary | ICD-10-CM

## 2011-11-01 LAB — TB SKIN TEST
Induration: 0 mm
TB Skin Test: NEGATIVE

## 2011-11-01 LAB — IFOBT (OCCULT BLOOD): IFOBT: NEGATIVE

## 2011-11-02 ENCOUNTER — Ambulatory Visit (INDEPENDENT_AMBULATORY_CARE_PROVIDER_SITE_OTHER): Payer: 59 | Admitting: Cardiology

## 2011-11-02 ENCOUNTER — Encounter: Payer: Self-pay | Admitting: Cardiology

## 2011-11-02 VITALS — BP 178/106 | HR 78 | Ht 66.0 in | Wt 206.8 lb

## 2011-11-02 DIAGNOSIS — I10 Essential (primary) hypertension: Secondary | ICD-10-CM

## 2011-11-02 DIAGNOSIS — R079 Chest pain, unspecified: Secondary | ICD-10-CM

## 2011-11-02 MED ORDER — ATORVASTATIN CALCIUM 20 MG PO TABS: 20.0000 mg | ORAL_TABLET | Freq: Every day | ORAL | Status: DC

## 2011-11-02 MED ORDER — ASPIRIN EC 81 MG PO TBEC: 81.0000 mg | DELAYED_RELEASE_TABLET | Freq: Every day | ORAL | Status: DC

## 2011-11-02 MED ORDER — BENAZEPRIL HCL 40 MG PO TABS: 40.0000 mg | ORAL_TABLET | Freq: Every morning | ORAL | Status: DC

## 2011-11-02 NOTE — Assessment & Plan Note (Signed)
Patient's chest pain is somewhat atypical. She ruled out for myocardial infarction with serial enzymes. Her stress echocardiogram showed no inducible wall motion abnormalities. She has not had chest pain since that time but has noticed some increased dyspnea on exertion. She has multiple risk factors including approximately 16 years of diabetes mellitus, hypertension and a strong family history. I have added enteric-coated aspirin 81 mg daily. I will also add a statin given her diabetes mellitus. We will begin Lipitor 20 mg daily and check lipids and liver in 6 weeks. Adjust medications for blood pressure control. She will see me back in 8 weeks. If she has any recurrent symptoms then she will most likely require cardiac catheterization for definitive evaluation given her risk factors.

## 2011-11-02 NOTE — Patient Instructions (Addendum)
Your physician recommends that you schedule a follow-up appointment in: 6-8 WEEKS WITH DR CRENSHAW  INCREASE LOTENSIN TO 40 MG ONCE DAILY  Your physician recommends that you return for lab work in: ONE WEEK  START ASPIRIN 81 MG ONCE DAILY WITH FOOD  START LIPITOR 20 MG ONCE DAILY  Your physician recommends that you return for lab work in: 6 WEEKS= FASTING

## 2011-11-02 NOTE — Assessment & Plan Note (Signed)
Blood pressure in the office today elevated. She was 176/104 in the left arm and 180/106 in the right arm. Increase benazepril to 40 mg daily. Check potassium and renal function in one week. Follow blood pressure at home and increase medications as needed.

## 2011-11-02 NOTE — Progress Notes (Signed)
HPI: 46 year old female for evaluation of chest pain. Patient recently seen in the emergency room on September 24 with complaints of chest pain. Chest x-ray negative. Troponins were negative. Patient noted to have a microcytic anemia. Liver functions normal. Renal function normal. Stress echocardiogram in September of 2013 was interpreted as normal by Dr. Myrtis Ser. Because of her chest pain we were asked to evaluate. Note on the day she was evaluated in the emergency room she developed sudden onset of diaphoresis, chest pressure, dizziness and shortness of breath while at work. The chest pain did not radiate. It was not pleuritic or positional. It resolved spontaneously. It lasted approximately 45 minutes. She had a second episode later that day which prompted her emergency room evaluation. She has not had chest pain since that time. She does note some dyspnea on exertion but there is no orthopnea, PND, pedal edema or syncope. She has exercise routinely previously and does not have exertional chest pain.  Current Outpatient Prescriptions  Medication Sig Dispense Refill  . benazepril (LOTENSIN) 40 MG tablet Take 1 tablet (40 mg total) by mouth every morning.  30 tablet  12  . ferrous sulfate 325 (65 FE) MG tablet Take 1 tablet (325 mg total) by mouth daily with breakfast.  30 tablet  2  . pioglitazone-metformin (ACTOPLUS MET) 15-500 MG per tablet Take 1 tablet by mouth every morning.      Marland Kitchen DISCONTD: benazepril (LOTENSIN) 20 MG tablet Take 20 mg by mouth every morning.      Marland Kitchen aspirin EC 81 MG tablet Take 1 tablet (81 mg total) by mouth daily.  90 tablet  3  . atorvastatin (LIPITOR) 20 MG tablet Take 1 tablet (20 mg total) by mouth daily.  90 tablet  3    Allergies  Allergen Reactions  . Penicillins Anaphylaxis and Hives  . Sulfa Antibiotics Anaphylaxis and Hives    Past Medical History  Diagnosis Date  . Hypertension   . Diabetes mellitus     Diagnosed at age 28    Past Surgical History    Procedure Date  . Cesarean section   . Tonsillectomy     History   Social History  . Marital Status: Divorced    Spouse Name: N/A    Number of Children: 2  . Years of Education: N/A   Occupational History  . [employer]   Social History Main Topics  . Smoking status: Never Smoker   . Smokeless tobacco: Not on file  . Alcohol Use: No  . Drug Use: No  . Sexually Active: No   Other Topics Concern  . Not on file   Social History Narrative  . No narrative on file    Family History  Problem Relation Age of Onset  . Coronary artery disease Father     MI at age 48    ROS: no fevers or chills, productive cough, hemoptysis, dysphasia, odynophagia, melena, hematochezia, dysuria, hematuria, rash, seizure activity, orthopnea, PND, pedal edema, claudication. Remaining systems are negative.  Physical Exam:   Blood pressure 178/106, pulse 78, height 5\' 6"  (1.676 m), weight 206 lb 12.8 oz (93.804 kg), last menstrual period 10/13/2011.  General:  Well developed/well nourished in NAD Skin warm/dry Patient not depressed No peripheral clubbing Back-normal HEENT-normal/normal eyelids Neck supple/normal carotid upstroke bilaterally; no bruits; no JVD; no thyromegaly chest - CTA/ normal expansion CV - RRR/normal S1 and S2; no murmurs, rubs or gallops;  PMI nondisplaced Abdomen -NT/ND, no HSM, no mass, +  bowel sounds, no bruit 2+ femoral pulses, no bruits Ext-no edema, chords, 2+ DP Neuro-grossly nonfocal  ECG 10/27/2011-sinus rhythm with PACs and no ST changes.

## 2011-11-11 ENCOUNTER — Other Ambulatory Visit (INDEPENDENT_AMBULATORY_CARE_PROVIDER_SITE_OTHER): Payer: 59

## 2011-11-11 DIAGNOSIS — I10 Essential (primary) hypertension: Secondary | ICD-10-CM

## 2011-11-11 LAB — BASIC METABOLIC PANEL
BUN: 14 mg/dL (ref 6–23)
CO2: 28 mEq/L (ref 19–32)
Calcium: 8.9 mg/dL (ref 8.4–10.5)
Chloride: 102 mEq/L (ref 96–112)
Creatinine, Ser: 1 mg/dL (ref 0.4–1.2)
GFR: 77.11 mL/min (ref >60.00)
Glucose, Bld: 198 mg/dL — ABNORMAL HIGH (ref 70–99)
Potassium: 3.7 mEq/L (ref 3.5–5.1)
Sodium: 138 mEq/L (ref 135–145)

## 2011-12-10 ENCOUNTER — Encounter: Payer: Self-pay | Admitting: *Deleted

## 2011-12-10 DIAGNOSIS — R0600 Dyspnea, unspecified: Secondary | ICD-10-CM | POA: Insufficient documentation

## 2011-12-10 DIAGNOSIS — D649 Anemia, unspecified: Secondary | ICD-10-CM | POA: Insufficient documentation

## 2011-12-10 DIAGNOSIS — N83209 Unspecified ovarian cyst, unspecified side: Secondary | ICD-10-CM | POA: Insufficient documentation

## 2011-12-10 DIAGNOSIS — R5383 Other fatigue: Secondary | ICD-10-CM | POA: Insufficient documentation

## 2011-12-11 ENCOUNTER — Ambulatory Visit: Payer: 59 | Admitting: Cardiology

## 2011-12-16 ENCOUNTER — Ambulatory Visit: Payer: Self-pay | Admitting: Physician Assistant

## 2012-07-26 ENCOUNTER — Ambulatory Visit (INDEPENDENT_AMBULATORY_CARE_PROVIDER_SITE_OTHER): Payer: 59 | Admitting: Family Medicine

## 2012-07-26 VITALS — BP 182/103 | HR 89 | Temp 98.3°F | Resp 16 | Ht 65.75 in | Wt 203.0 lb

## 2012-07-26 DIAGNOSIS — D649 Anemia, unspecified: Secondary | ICD-10-CM

## 2012-07-26 DIAGNOSIS — I10 Essential (primary) hypertension: Secondary | ICD-10-CM

## 2012-07-26 DIAGNOSIS — E1165 Type 2 diabetes mellitus with hyperglycemia: Secondary | ICD-10-CM

## 2012-07-26 DIAGNOSIS — E119 Type 2 diabetes mellitus without complications: Secondary | ICD-10-CM

## 2012-07-26 LAB — POCT CBC
Granulocyte percent: 71.1 %G (ref 37–80)
HCT, POC: 34.7 % — AB (ref 37.7–47.9)
Hemoglobin: 10.7 g/dL — AB (ref 12.2–16.2)
Lymph, poc: 1.9 (ref 0.6–3.4)
MCH, POC: 24.8 pg — AB (ref 27–31.2)
MCHC: 30.8 g/dL — AB (ref 31.8–35.4)
MCV: 80.5 fL (ref 80–97)
MID (cbc): 0.5 (ref 0–0.9)
MPV: 10.3 fL (ref 0–99.8)
POC Granulocyte: 6 (ref 2–6.9)
POC LYMPH PERCENT: 22.6 %L (ref 10–50)
POC MID %: 6.3 %M (ref 0–12)
Platelet Count, POC: 240 10*3/uL (ref 142–424)
RBC: 4.31 M/uL (ref 4.04–5.48)
RDW, POC: 15.8 %
WBC: 8.4 10*3/uL (ref 4.6–10.2)

## 2012-07-26 LAB — GLUCOSE, POCT (MANUAL RESULT ENTRY): POC Glucose: 175 mg/dl — AB (ref 70–99)

## 2012-07-26 LAB — POCT GLYCOSYLATED HEMOGLOBIN (HGB A1C): Hemoglobin A1C: 7.9

## 2012-07-26 MED ORDER — METFORMIN HCL 500 MG PO TABS: ORAL_TABLET | ORAL | Status: DC

## 2012-07-26 MED ORDER — HYDROCHLOROTHIAZIDE 12.5 MG PO CAPS: 12.5000 mg | ORAL_CAPSULE | Freq: Every day | ORAL | Status: DC

## 2012-07-26 MED ORDER — BENAZEPRIL HCL 40 MG PO TABS: 40.0000 mg | ORAL_TABLET | Freq: Every morning | ORAL | Status: DC

## 2012-07-26 NOTE — Progress Notes (Signed)
Subjective:    Patient ID: Robin Delacruz, female    DOB: 1965/04/03, 47 y.o.   MRN: [id number]  HPI  Robin Delacruz is a 47 y.o. female  Hx of Dm2, HTN. Last seen by me in September 2013, then seen by cardiology few days later. Blood pressure elevated at that office visit, increased benazepril to 40mg  qd, and cardiologist added Lipitor 20mg  QD, with plan for labs in 6 weeks and follow up in 8 weeks. Has not followed up since then.   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 Needed improved control in BP and some kind of summary or letter of her meds. BP there - 170/100, random glucose 201.   HTN - home blood pressures: 128-190/74-110.  Noted dizziness last week when BP 190/84, but blood sugar 200 at the time.  Dizziness resolved in about an hour. No residual weakness. No chest pain. No slurred speech.  Missed 2 days last weekend, but usually takes benazepril 40mg  every day.   DM2 - home blood sugars - 140's in the morning, after eating up to 200.  Lowest around 140.  Takes Actoplus met - 15/500 QD.  No intolerances to this, but on these meds for about 10 years. No diarrhea.   Hx of anemia with DUB, followed by Dr. Myrtis Ser @ [facility] - last appt July 2013- normal pap.Dr. Myrtis Ser. Hgb 9.9 last year.  Per notes - had the diagnosis of dysfunctional uterine bleeding and recently underwent uterine ablation - 2010. Taking iron once per day.   Fasting since 12:30pm. (6 1/2 hours)  Review of Systems  Constitutional: Negative for fatigue and unexpected  weight change.  Respiratory: Negative for chest tightness and shortness of breath.   Cardiovascular: Negative for chest pain, palpitations and leg swelling.  Gastrointestinal: Negative for abdominal pain and blood in stool.       Lower abdomen/bladder area  Genitourinary: Negative for difficulty urinating.  Neurological: Positive for dizziness (as above - once last week. ). Negative for syncope, light-headedness and headaches.       Objective:   Physical Exam  Vitals reviewed. Constitutional: She is oriented to person, place, and time. She appears well-developed and well-nourished.  HENT:  Head: Normocephalic and atraumatic.  Mouth/Throat: Oropharynx is clear and moist.  Eyes: Conjunctivae and EOM are normal. Pupils are equal, round, and reactive to light.  Neck: Normal range of motion. No thyromegaly present.  Cardiovascular: Normal rate, regular rhythm, normal heart sounds and intact distal pulses.   No murmur heard. Pulmonary/Chest: Effort normal. She has no wheezes. She has no rales.  Abdominal: Soft. Normal appearance. She exhibits no abdominal bruit and no pulsatile midline mass. There is no tenderness.  Neurological: She is alert and oriented to person, place, and time.  Microfilament testing WNL bilat feet  Skin: Skin is warm and dry.  Psychiatric:  She has a normal mood and affect. Her behavior is normal.   Results for orders placed in visit on 07/26/12  POCT CBC      Result Value Range   WBC 8.4  4.6 - 10.2 K/uL   Lymph, poc 1.9  0.6 - 3.4   POC LYMPH PERCENT 22.6  10 - 50 %L   MID (cbc) 0.5  0 - 0.9   POC MID % 6.3  0 - 12 %M   POC Granulocyte 6.0  2 - 6.9   Granulocyte percent 71.1  37 - 80 %G   RBC 4.31  4.04 - 5.48 M/uL   Hemoglobin 10.7 (*) 12.2 - 16.2 g/dL   HCT, POC 16.1 (*) 09.6 - 47.9 %   MCV 80.5  80 - 97 fL   MCH, POC 24.8 (*) 27 - 31.2 pg   MCHC 30.8 (*) 31.8 - 35.4 g/dL   RDW, POC 04.5     Platelet Count, POC 240  142 - 424 K/uL   MPV 10.3  0 - 99.8  fL  GLUCOSE, POCT (MANUAL RESULT ENTRY)      Result Value Range   POC Glucose 175 (*) 70 - 99 mg/dl  POCT GLYCOSYLATED HEMOGLOBIN (HGB A1C)      Result Value Range   Hemoglobin A1C 7.9         Assessment & Plan:  Robin Delacruz is a 47 y.o. female Hypertension - Plan: POCT CBC, POCT glucose (manual entry), POCT glycosylated hemoglobin (Hb A1C), Comprehensive metabolic panel, Lipid panel, benazepril (LOTENSIN) 40 MG tablet, hydrochlorothiazide (MICROZIDE) 12.5 MG capsule  Diabetes - Plan: POCT CBC, POCT glucose (manual entry), POCT glycosylated hemoglobin (Hb A1C), Comprehensive metabolic panel, Lipid panel, metFORMIN (GLUCOPHAGE) 500 MG tablet  Anemia  DM (diabetes mellitus), type 2, uncontrolled  HTN  - uncontrolled. Will add hctz 12.5mg  qd, then increase to 25mg  in next week or two if not improving. Continue benazepril at 40mg  qd. Orthostatic precautions. Plans on diet changes as well. Will check CMP, lipid panel as fasting almost 7 hours.  Can be repeated if needed.   DM2- not at goal, and concern of Actos and possible risks discussed, decision made to change to just metformin.  Will increase 500 to 2 qam, then 1 pm.  If blood sugars still elevated - increase to 1000mg  BID.   Anemia - stable, but can increase to BID iron.   Recheck in 6 weeks for above. Letter given to patient for her company's evaluating physician/physical.   Meds ordered this encounter  Medications  . benazepril (LOTENSIN) 40 MG tablet    Sig: Take 1 tablet (40 mg total) by mouth every morning.    Dispense:  90 tablet    Refill:  1  . hydrochlorothiazide (MICROZIDE) 12.5 MG capsule    Sig: Take 1 capsule (12.5 mg total) by mouth daily.    Dispense:  30 capsule    Refill:  1  . metFORMIN (GLUCOPHAGE) 500 MG tablet    Sig: Take 2 tabs each morning, then 1 tab each night.    Dispense:  180 tablet    Refill:  1   Patient Instructions  For your blood pressure - add hydrochlorothiazide - 1 per day. If  blood pressures still over 140/90 in 1 week - increase to 2 of the hydrochlorothiazide each day.  Continue benazepril at same dose. Keep a record of your blood pressures outside of the office and bring them to the next office visit. You  should receive a call or letter about your lab results within the next week to 10 days.  Stop the actoplusmet. Start metformin 500mg  - 2 each morning, 1 each night.  If blood sugars remain above 200 in 2 weeks - can take 2 tabs twice per day.  Recheck with me in 6 weeks.  Call if you are running out of meds prior to that visit.

## 2012-07-26 NOTE — Patient Instructions (Addendum)
For your blood pressure - add hydrochlorothiazide - 1 per day. If blood pressures still over 140/90 in 1 week - increase to 2 of the hydrochlorothiazide each day.  Continue benazepril at same dose. Keep a record of your blood pressures outside of the office and bring them to the next office visit. You should receive a call or letter about your lab results within the next week to 10 days.  Stop the actoplusmet. Start metformin 500mg  - 2 each morning, 1 each night.  If blood sugars remain above 200 in 2 weeks - can take 2 tabs twice per day.  Recheck with me in 6 weeks.  Call if you are running out of meds prior to that visit.

## 2012-07-27 LAB — COMPREHENSIVE METABOLIC PANEL
ALT: 12 U/L (ref 0–35)
AST: 14 U/L (ref 0–37)
Albumin: 3.9 g/dL (ref 3.5–5.2)
Alkaline Phosphatase: 64 U/L (ref 39–117)
BUN: 16 mg/dL (ref 6–23)
CO2: 29 mEq/L (ref 19–32)
Calcium: 9.6 mg/dL (ref 8.4–10.5)
Chloride: 102 mEq/L (ref 96–112)
Creat: 1.03 mg/dL (ref 0.50–1.10)
Glucose, Bld: 147 mg/dL — ABNORMAL HIGH (ref 70–99)
Potassium: 4.4 mEq/L (ref 3.5–5.3)
Sodium: 139 mEq/L (ref 135–145)
Total Bilirubin: 0.4 mg/dL (ref 0.3–1.2)
Total Protein: 7.3 g/dL (ref 6.0–8.3)

## 2012-07-27 LAB — LIPID PANEL
Cholesterol: 102 mg/dL (ref 0–200)
HDL: 37 mg/dL — ABNORMAL LOW (ref >39)
LDL Cholesterol: 41 mg/dL (ref 0–99)
Total CHOL/HDL Ratio: 2.8 Ratio
Triglycerides: 119 mg/dL (ref <150)
VLDL: 24 mg/dL (ref 0–40)

## 2012-11-02 ENCOUNTER — Other Ambulatory Visit: Payer: Self-pay | Admitting: Cardiology

## 2012-12-13 ENCOUNTER — Ambulatory Visit (INDEPENDENT_AMBULATORY_CARE_PROVIDER_SITE_OTHER): Payer: BC Managed Care – PPO | Admitting: Family Medicine

## 2012-12-13 VITALS — BP 165/100 | HR 82 | Temp 98.6°F | Resp 18 | Ht 66.5 in | Wt 206.2 lb

## 2012-12-13 DIAGNOSIS — I10 Essential (primary) hypertension: Secondary | ICD-10-CM

## 2012-12-13 DIAGNOSIS — Z23 Encounter for immunization: Secondary | ICD-10-CM

## 2012-12-13 DIAGNOSIS — E119 Type 2 diabetes mellitus without complications: Secondary | ICD-10-CM

## 2012-12-13 LAB — POCT GLYCOSYLATED HEMOGLOBIN (HGB A1C): Hemoglobin A1C: 6.1

## 2012-12-13 MED ORDER — ATORVASTATIN CALCIUM 20 MG PO TABS: ORAL_TABLET | ORAL | Status: DC

## 2012-12-13 MED ORDER — AMLODIPINE BESYLATE 5 MG PO TABS: 5.0000 mg | ORAL_TABLET | Freq: Every day | ORAL | Status: DC

## 2012-12-13 MED ORDER — BENAZEPRIL HCL 40 MG PO TABS: ORAL_TABLET | ORAL | Status: DC

## 2012-12-13 NOTE — Progress Notes (Signed)
[facility] [address], [place] [address] [phone]  Date:  12/13/2012   Name:  Robin Delacruz   DOB:  [birth year]   MRN:  [id number]  PCP:  [name], MD    Chief Complaint: Medication Refill, Flu Vaccine, Rash and Medication Review   History of Present Illness:  Robin Delacruz is a 48 y.o. very pleasant female patient who presents with the following:  Here today with a few concerns.  Last here in June with concern about HTN.  Her BP was 182/103 at that time.  HCTZ was added to her benazepril.  She has noted nausea with the HCTZ.  She has tried taking it different times of day and with food but could not tolerate it.   No vomiting.  She did stop taking it about 2 weeks ago.    She was put on plain metformin for DM at that time. She had been on actos but this was stopped, metformin dose increased  She is taking metformin 1500 a day currently Lipitor for prevention. Aspirin as well.    Her FBG in the am is around 150. She may get up to 200 after eating.    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  While there she developed an itchy rash on her left upper arm over the last 1.5 weeks.  She then tried an OTC anti- fungal cream and thinks she is having a rxn to this as she has developed more redness and itching in the distribution of where she applied the cream.  Otherwise she feels well.  Notes that she ate very well and got a lot of exercise during her basic training  She is s/p BTL.   Wt Readings from Last 3 Encounters:  12/13/12 206 lb 3.2 oz (93.532 kg)  07/26/12 203 lb (92.08 kg)  11/02/11 206 lb 12.8 oz (93.804 kg)     Patient Active Problem List   Diagnosis Date Noted  . Ovarian cyst   . Anemia   . Dyspnea   . Fatigue   . Chest pain 11/02/2011  . Hypertension     Past Medical History  Diagnosis Date  . Hypertension   . Diabetes mellitus     Diagnosed at age 54  . Chest  pain   . Fatigue   . Dyspnea   . Anemia   . Ovarian cyst     Past Surgical History  Procedure Laterality Date  . Cesarean section    . Tonsillectomy      History  Substance Use Topics  . Smoking status: Never Smoker   . Smokeless tobacco: Not on file  . Alcohol Use: No    Family History  Problem Relation Age of Onset  . Coronary artery disease Father     MI at age 66  . Diabetes Father   . Heart disease Father   . Hyperlipidemia Mother   . Thyroid disease Sister   . Thyroid disease Daughter   . Diabetes Maternal Grandmother   . Hypertension Maternal Grandmother   . Diabetes Paternal Grandmother   . Hyperlipidemia Paternal Grandmother   . Diabetes Paternal Grandfather   . Hyperlipidemia Paternal Grandfather     Allergies  Allergen Reactions  . Penicillins Anaphylaxis and Hives  . Sulfa Antibiotics Anaphylaxis and Hives    Medication list has been reviewed and updated.  Current Outpatient Prescriptions on File Prior to Visit  Medication Sig Dispense Refill  . aspirin EC 81 MG tablet Take 1 tablet (81 mg total) by mouth daily.  90 tablet  3  . atorvastatin (LIPITOR) 20 MG tablet TAKE 1 TABLET BY MOUTH EVERY DAY  30 tablet  0  . benazepril (LOTENSIN) 40 MG tablet TAKE 1 TABLET BY MOUTH EVERY MORNING  30 tablet  0  . ferrous sulfate 325 (65 FE) MG tablet Take 1 tablet (325 mg total) by mouth daily with breakfast.  30 tablet  2  . hydrochlorothiazide (MICROZIDE) 12.5 MG capsule Take 1 capsule (12.5 mg total) by mouth daily.  30 capsule  1  . metFORMIN (GLUCOPHAGE) 500 MG tablet Take 2 tabs each morning, then 1 tab each night.  180 tablet  1   No current facility-administered medications on file prior to visit.    Review of Systems:  As per HPI- otherwise negative. She has never had a pneumovax, would like to get her flu shot today  Physical Examination: Filed Vitals:   12/13/12 1414  BP: 186/104  Pulse: 82  Temp: 98.6 F (37 C)  Resp: 18   Filed  Vitals:   12/13/12 1414  Height: 5' 6.5" (1.689 m)  Weight: 206 lb 3.2 oz (93.532 kg)   Body mass index is 32.79 kg/(m^2). Ideal Body Weight: Weight in (lb) to have BMI = 25: 156.9  GEN: WDWN, NAD, Non-toxic, A & O x 3, obese, looks well HEENT: Atraumatic, Normocephalic. Neck supple. No masses, No LAD. Ears and Nose: No external deformity. CV: RRR, No M/G/R. No JVD. No thrill. No extra heart sounds. PULM: CTA B, no wheezes, crackles, rhonchi. No retractions. No resp. distress. No accessory muscle use.Marland Kitchen EXTR: No c/c/e NEURO Normal gait.  PSYCH: Normally interactive. Conversant. Not depressed or anxious appearing.  Calm demeanor.  Left upper arm: she indicates two scaly, round areas that appear to be resolving as the original rash. This area is now surrounded by what appears to be a mild allergic dermatitis, similar to appearance of rhus dermatitis.  No sign of infection   Results for orders placed in visit on 12/13/12  POCT GLYCOSYLATED HEMOGLOBIN (HGB A1C)      Result Value Range   Hemoglobin A1C 6.1      Assessment and Plan: Need for prophylactic vaccination and inoculation against influenza - Plan: Flu Vaccine QUAD 36+ mos PF IM (Fluarix)  Type II or unspecified type diabetes mellitus without mention of complication, not stated as uncontrolled - Plan: atorvastatin (LIPITOR) 20 MG tablet, Pneumococcal polysaccharide vaccine 23-valent greater than or equal to 2yo subcutaneous/IM, POCT glycosylated hemoglobin (Hb A1C)  HTN (hypertension) - Plan: amLODipine (NORVASC) 5 MG tablet, benazepril (LOTENSIN) 40 MG tablet  HTN, uncontrolled.  Add norvasc to her regimen Dm: good control.  May cut back to metformin 500 BID if she likes. Encouraged her to keep up her improved diet and exercise habits.   Flu shot and pneumovax today.    Recheck 3 or 4 months.  If BP remains high after going ot 10 mg of norvasc she is to call or RTC sooner.   Signed Abbe Amsterdam, MD

## 2012-12-13 NOTE — Patient Instructions (Signed)
We will add amlodipine at 5mg  to your BP regimen.  Assuming your BP does not get to around 130/80 after a week or two you can go to 10 mg of amlodipine daily.    Remember to keep up with your eye exams yearly!  As a diabetic, there are several things you can do to monitor your condition and maintain your health.  1. Check your feet daily for any skin breakdown 2. Exercise and keep track of your diet 3. Let us know before you run out of your medications 4. Get your annual flu shot, and ask if you need a pneumonia shot 5. Ask if you are up to date on your labs; you should have an A1c every 6 months, a urine protein test annually, and a cholesterol test annually.  Your doctor may decide to do labs more often if indicated 6. Take off your shoes and socks at each visit.  Be sure your doctor examines your feet.   7. Ask about your blood pressure.  Your goal is 130/ 80 or less 8. Get an annual eye exam.  Please ask your ophthalmologist to send [place] your report 9. Keep up with your dental cleanings and exams.

## 2013-01-02 ENCOUNTER — Other Ambulatory Visit: Payer: Self-pay | Admitting: Family Medicine

## 2013-05-27 ENCOUNTER — Other Ambulatory Visit: Payer: Self-pay | Admitting: Family Medicine

## 2013-10-25 ENCOUNTER — Encounter: Payer: Self-pay | Admitting: Family Medicine

## 2014-01-05 ENCOUNTER — Other Ambulatory Visit: Payer: Self-pay | Admitting: Family Medicine

## 2014-01-08 ENCOUNTER — Ambulatory Visit: Payer: BC Managed Care – PPO | Admitting: Family Medicine

## 2014-01-15 IMAGING — CR DG CHEST 2V
2 series · 2 of 2 positions shown · non-contrast
Comparison: None.

CLINICAL DATA: Recurrent chest pain

CHEST - 2 VIEW

[w chest pa]
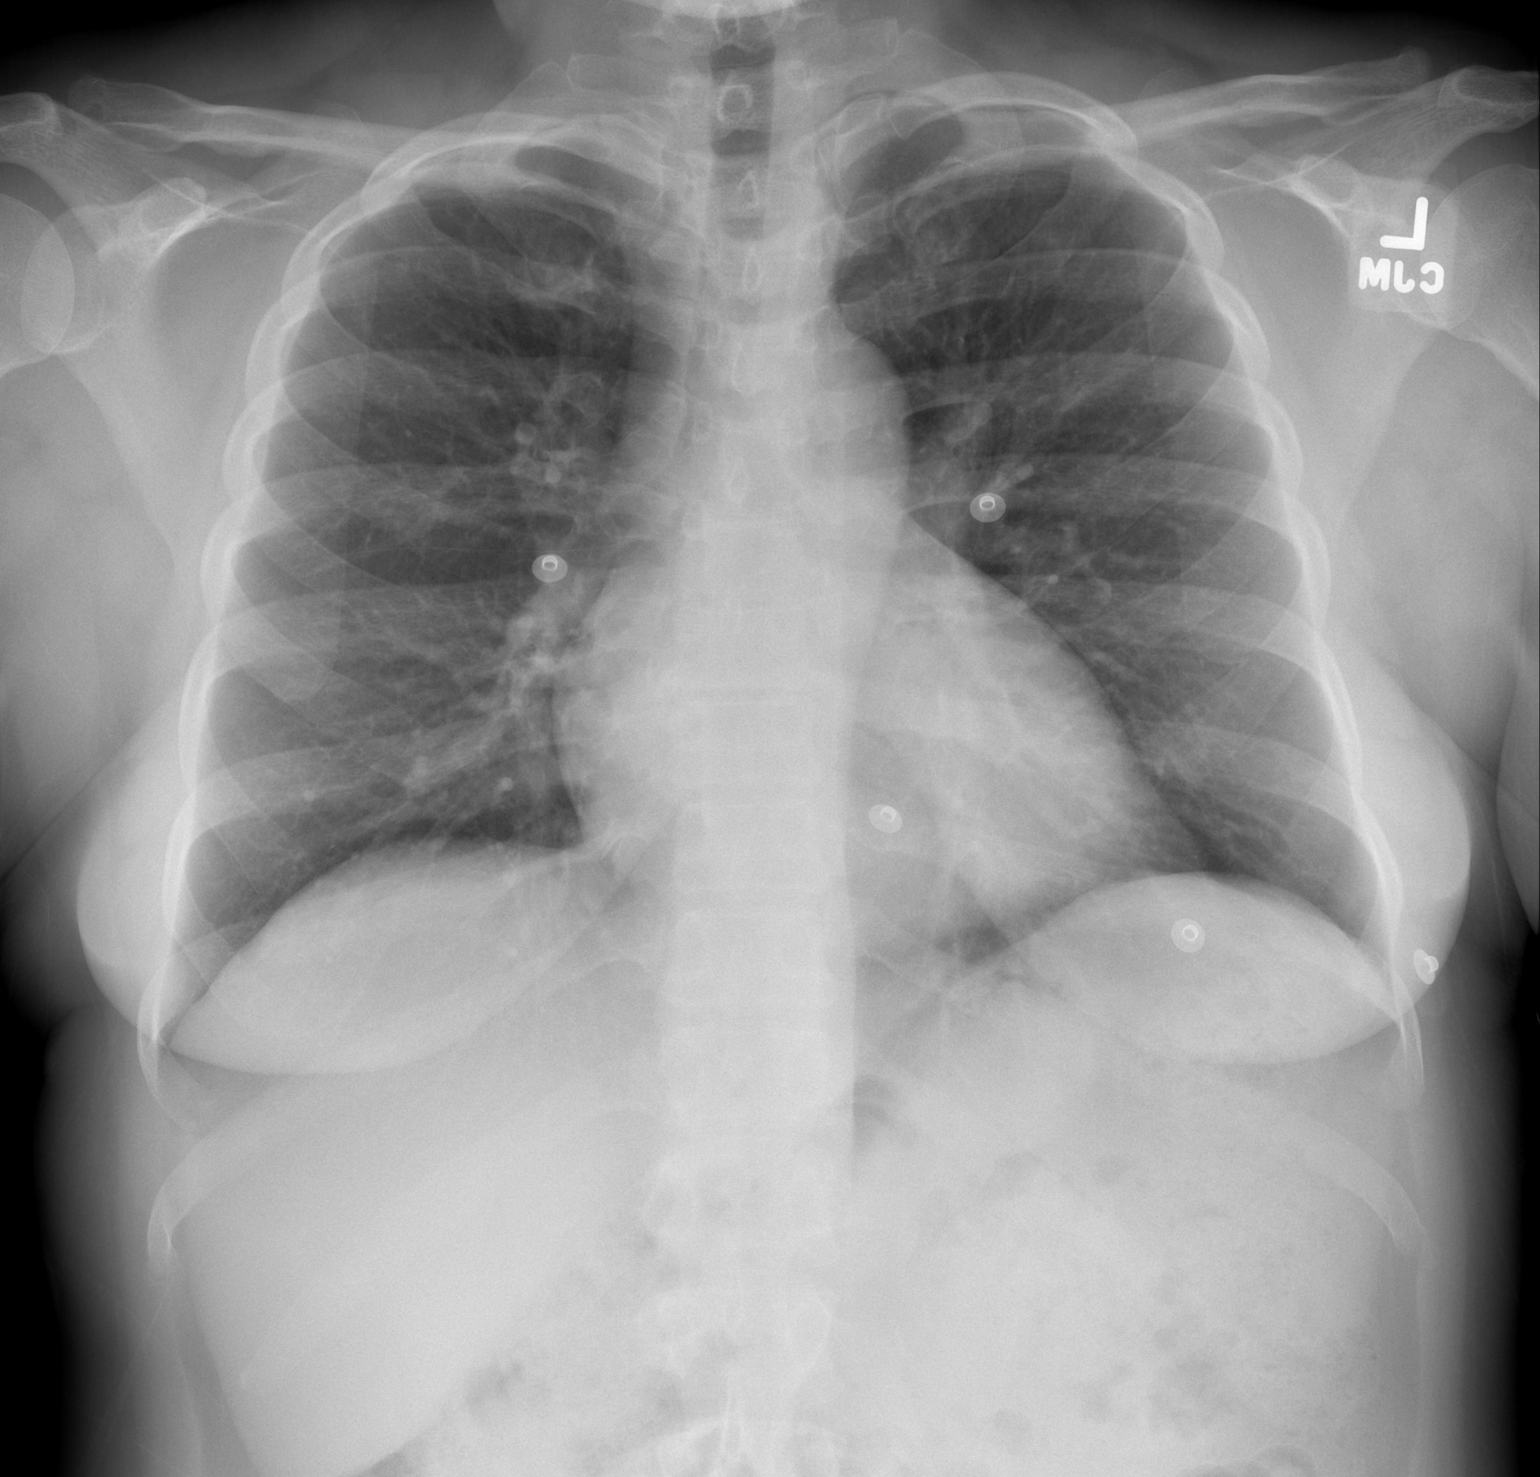

[w chest lat]
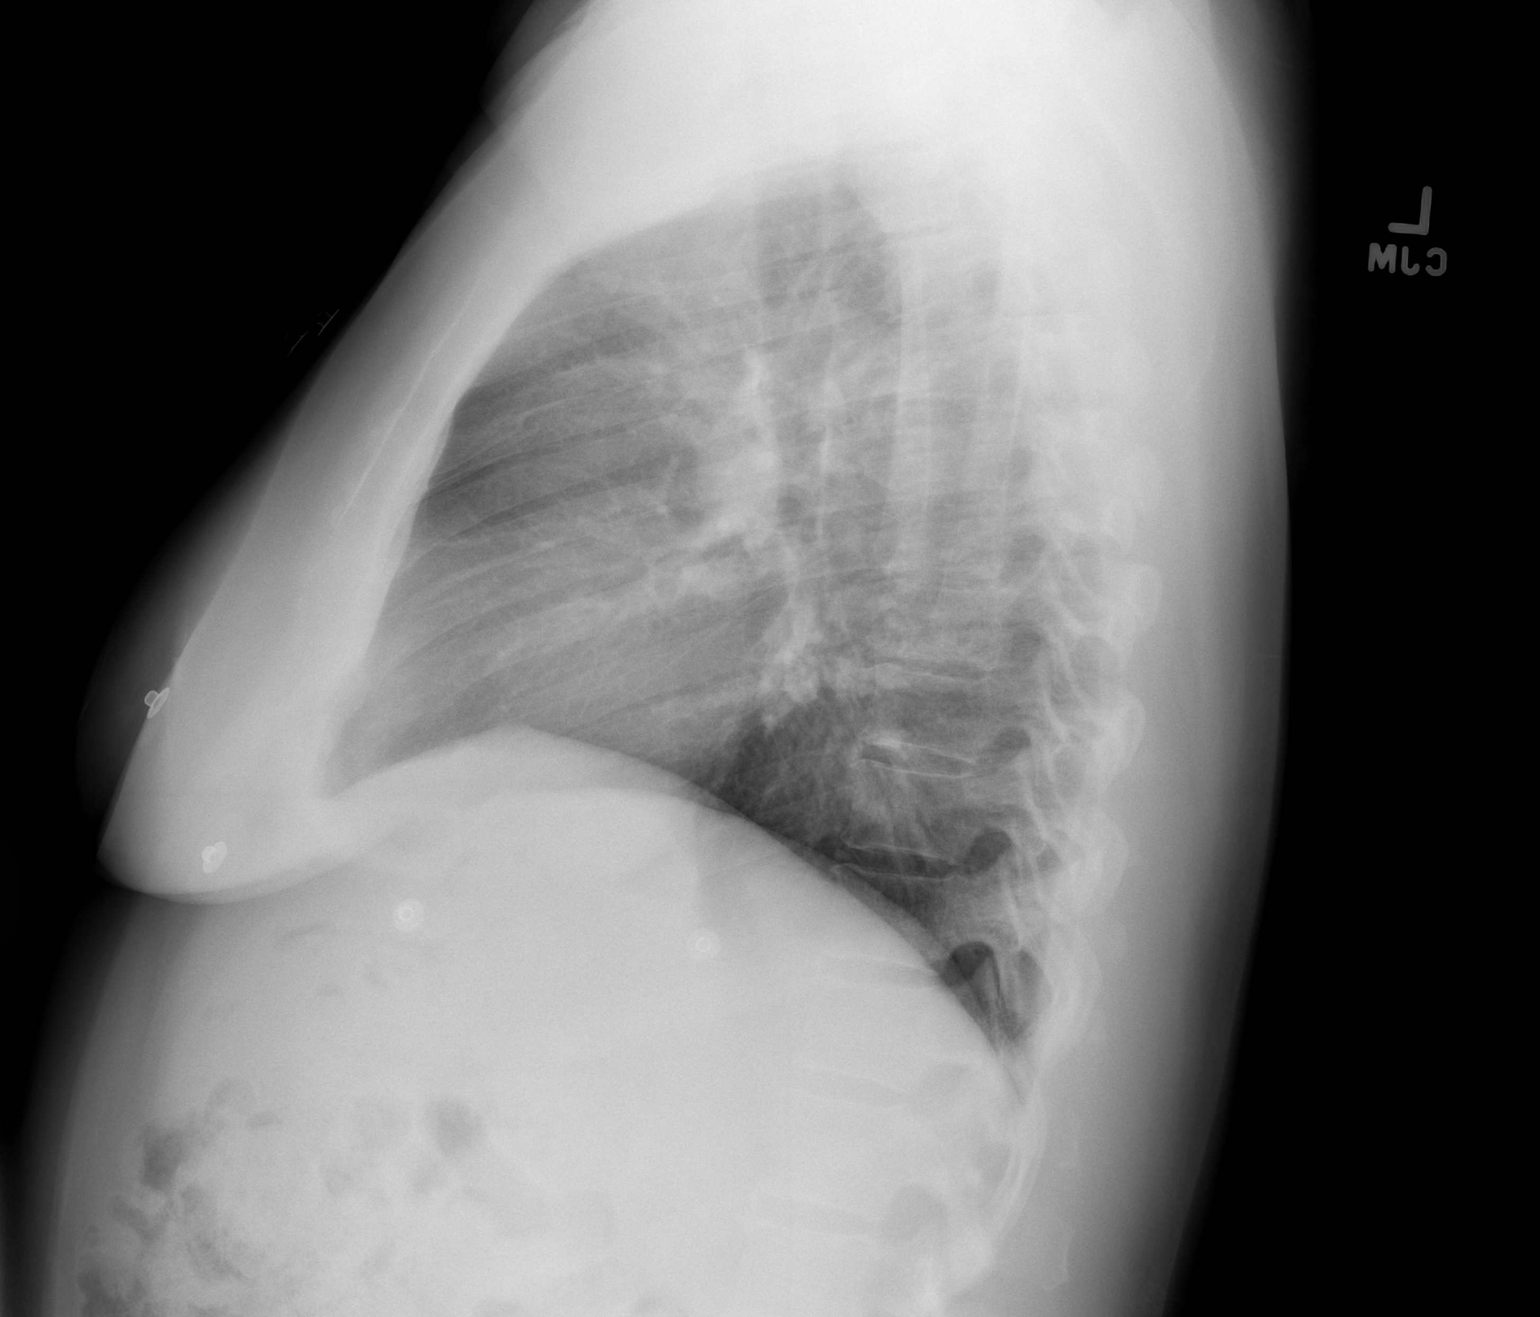

[2 of 2 positions shown; findings below may reference images not displayed]

FINDINGS: Lungs are essentially clear.  Very mild/vague opacity in
the right upper lobe is favored to reflect normal pulmonary
markings.  No pleural effusion or pneumothorax.

The heart is top normal in size.

Mild degenerative changes of the visualized thoracolumbar spine.
IMPRESSION: No evidence of acute cardiopulmonary disease.

## 2014-02-12 ENCOUNTER — Ambulatory Visit: Payer: BC Managed Care – PPO | Admitting: Family Medicine

## 2014-02-20 ENCOUNTER — Other Ambulatory Visit: Payer: Self-pay | Admitting: Physician Assistant

## 2014-03-05 ENCOUNTER — Ambulatory Visit (INDEPENDENT_AMBULATORY_CARE_PROVIDER_SITE_OTHER): Payer: BC Managed Care – PPO | Admitting: Family Medicine

## 2014-03-05 VITALS — BP 138/86 | HR 69 | Temp 98.3°F | Resp 20 | Ht 65.5 in | Wt 203.4 lb

## 2014-03-05 DIAGNOSIS — R062 Wheezing: Secondary | ICD-10-CM

## 2014-03-05 DIAGNOSIS — E119 Type 2 diabetes mellitus without complications: Secondary | ICD-10-CM

## 2014-03-05 DIAGNOSIS — I10 Essential (primary) hypertension: Secondary | ICD-10-CM

## 2014-03-05 DIAGNOSIS — J069 Acute upper respiratory infection, unspecified: Secondary | ICD-10-CM

## 2014-03-05 DIAGNOSIS — J209 Acute bronchitis, unspecified: Secondary | ICD-10-CM

## 2014-03-05 MED ORDER — BENZONATATE 100 MG PO CAPS: 100.0000 mg | ORAL_CAPSULE | Freq: Three times a day (TID) | ORAL | Status: DC | PRN

## 2014-03-05 MED ORDER — AZITHROMYCIN 250 MG PO TABS: ORAL_TABLET | ORAL | Status: DC

## 2014-03-05 MED ORDER — BENAZEPRIL HCL 40 MG PO TABS: 40.0000 mg | ORAL_TABLET | Freq: Every morning | ORAL | Status: DC

## 2014-03-05 MED ORDER — HYDROCODONE-HOMATROPINE 5-1.5 MG/5ML PO SYRP: 5.0000 mL | ORAL_SOLUTION | ORAL | Status: DC | PRN

## 2014-03-05 MED ORDER — AMLODIPINE BESYLATE 5 MG PO TABS: ORAL_TABLET | ORAL | Status: DC

## 2014-03-05 MED ORDER — ALBUTEROL SULFATE HFA 108 (90 BASE) MCG/ACT IN AERS: 2.0000 | INHALATION_SPRAY | RESPIRATORY_TRACT | Status: DC | PRN

## 2014-03-05 MED ORDER — HYDROCHLOROTHIAZIDE 12.5 MG PO CAPS: 12.5000 mg | ORAL_CAPSULE | Freq: Every day | ORAL | Status: DC

## 2014-03-05 MED ORDER — METFORMIN HCL 500 MG PO TABS: 500.0000 mg | ORAL_TABLET | Freq: Two times a day (BID) | ORAL | Status: DC

## 2014-03-05 NOTE — Progress Notes (Signed)
Subjective: 49 year old lady who is here with a respiratory tract infection this been going on for about 2 weeks. She had a lot of head congestion and cough, now it seems to be getting worse with the cough. She still is blowing stuff out of her head. What she coughs up is gotten to be a thick green. She does not smoke. She's not been febrile. Her throat has hurt some. She usually get something like this about once a year. 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 Many of them have been ill. Her spouse is not sick. She is allergic to penicillins and sulfa. She is diabetic.  Objective: Alert and oriented. Her TMs are normal. Throat not very erythematous. Neck supple, little tender, no major nodes. Her chest is clear to auscultation with forced expiration wheeze. Heart regular without murmurs.  Assessment: Upper respiratory infection with secondary bronchitis  Plan: This is going on for couple weeks, seems to be getting worse rather than better, therefore will gland treat with antibiotics. Will use azithromycin since she is allergic to sulfa and penicillin. We will give a bronchodilator also because of the end expiratory wheeze. She has been having nighttime coughing.

## 2014-03-05 NOTE — Patient Instructions (Signed)
Drink plenty of fluids and get enough rest  Use the inhaler 2 inhalations every 4-6 hours if needed for wheezing and coughing  Take the azithromycin antibiotic 2 pills initially, then 1 daily for 4 days  Take the benzonatate cough pills one or 2 pills 3 times daily if needed for cough  Take the Hycodan cough syrup primarily at nighttime when you're not going to be working, 1 teaspoon every 4-6 hours as needed for bad cough. It tends to be sedating.  We are not doing a lot of other diagnostic testing or x-rays are needed today.  If you're getting worse she needs to return.

## 2014-03-13 MED ORDER — AZITHROMYCIN 250 MG PO TABS: ORAL_TABLET | ORAL | Status: DC

## 2014-03-13 NOTE — Addendum Note (Signed)
Addended by: Sheppard PlumberBRIGGS, Chirstine Defrain A on: 03/13/2014 04:55 PM   Modules accepted: Orders

## 2014-03-27 ENCOUNTER — Other Ambulatory Visit: Payer: Self-pay | Admitting: Physician Assistant

## 2014-03-28 NOTE — Telephone Encounter (Signed)
Dr Alwyn RenHopper, pt was just in to see you for DM, HTN and acute issues, but don't see chol addressed. We have been putting notices on her last 2 RFs that she needs OV for RFs, and then she did come in, but don't see lipid test since 2014. OK to RF?

## 2014-08-04 ENCOUNTER — Ambulatory Visit (INDEPENDENT_AMBULATORY_CARE_PROVIDER_SITE_OTHER): Payer: BC Managed Care – PPO | Admitting: Family Medicine

## 2014-08-04 VITALS — BP 178/100 | HR 72 | Temp 98.4°F | Resp 17 | Ht 66.0 in | Wt 200.4 lb

## 2014-08-04 DIAGNOSIS — Z1211 Encounter for screening for malignant neoplasm of colon: Secondary | ICD-10-CM

## 2014-08-04 DIAGNOSIS — Z0000 Encounter for general adult medical examination without abnormal findings: Secondary | ICD-10-CM

## 2014-08-04 DIAGNOSIS — I10 Essential (primary) hypertension: Secondary | ICD-10-CM | POA: Diagnosis not present

## 2014-08-04 DIAGNOSIS — D649 Anemia, unspecified: Secondary | ICD-10-CM

## 2014-08-04 DIAGNOSIS — N951 Menopausal and female climacteric states: Secondary | ICD-10-CM | POA: Diagnosis not present

## 2014-08-04 DIAGNOSIS — E119 Type 2 diabetes mellitus without complications: Secondary | ICD-10-CM | POA: Diagnosis not present

## 2014-08-04 DIAGNOSIS — E785 Hyperlipidemia, unspecified: Secondary | ICD-10-CM | POA: Diagnosis not present

## 2014-08-04 DIAGNOSIS — Z8349 Family history of other endocrine, nutritional and metabolic diseases: Secondary | ICD-10-CM

## 2014-08-04 DIAGNOSIS — Z1329 Encounter for screening for other suspected endocrine disorder: Secondary | ICD-10-CM

## 2014-08-04 LAB — HEMOGLOBIN A1C
Hgb A1c MFr Bld: 7.2 % — ABNORMAL HIGH (ref <5.7)
Mean Plasma Glucose: 160 mg/dL — ABNORMAL HIGH (ref <117)

## 2014-08-04 LAB — POCT CBC
Granulocyte percent: 56.5 %G (ref 37–80)
HCT, POC: 40.9 % (ref 37.7–47.9)
Hemoglobin: 12.9 g/dL (ref 12.2–16.2)
Lymph, poc: 2 (ref 0.6–3.4)
MCH, POC: 24.2 pg — AB (ref 27–31.2)
MCHC: 31.5 g/dL — AB (ref 31.8–35.4)
MCV: 76.8 fL — AB (ref 80–97)
MID (cbc): 0.5 (ref 0–0.9)
MPV: 8.3 fL (ref 0–99.8)
POC Granulocyte: 3.3 (ref 2–6.9)
POC LYMPH PERCENT: 34.5 %L (ref 10–50)
POC MID %: 9 %M (ref 0–12)
Platelet Count, POC: 273 10*3/uL (ref 142–424)
RBC: 5.33 M/uL (ref 4.04–5.48)
RDW, POC: 14.5 %
WBC: 5.9 10*3/uL (ref 4.6–10.2)

## 2014-08-04 LAB — COMPREHENSIVE METABOLIC PANEL
ALT: 12 U/L (ref 0–35)
AST: 14 U/L (ref 0–37)
Albumin: 3.8 g/dL (ref 3.5–5.2)
Alkaline Phosphatase: 64 U/L (ref 39–117)
BUN: 15 mg/dL (ref 6–23)
CO2: 28 mEq/L (ref 19–32)
Calcium: 9.1 mg/dL (ref 8.4–10.5)
Chloride: 103 mEq/L (ref 96–112)
Creat: 0.91 mg/dL (ref 0.50–1.10)
Glucose, Bld: 151 mg/dL — ABNORMAL HIGH (ref 70–99)
Potassium: 4.3 mEq/L (ref 3.5–5.3)
Sodium: 139 mEq/L (ref 135–145)
Total Bilirubin: 0.4 mg/dL (ref 0.2–1.2)
Total Protein: 6.8 g/dL (ref 6.0–8.3)

## 2014-08-04 LAB — GLUCOSE, POCT (MANUAL RESULT ENTRY): POC Glucose: 157 mg/dl — AB (ref 70–99)

## 2014-08-04 LAB — MICROALBUMIN, URINE: Microalb, Ur: 1.1 mg/dL (ref <2.0)

## 2014-08-04 LAB — LIPID PANEL
Cholesterol: 144 mg/dL (ref 0–200)
HDL: 39 mg/dL — ABNORMAL LOW (ref >=46)
LDL Cholesterol: 80 mg/dL (ref 0–99)
Total CHOL/HDL Ratio: 3.7 Ratio
Triglycerides: 126 mg/dL (ref <150)
VLDL: 25 mg/dL (ref 0–40)

## 2014-08-04 LAB — TSH: TSH: 2.178 u[IU]/mL (ref 0.350–4.500)

## 2014-08-04 MED ORDER — AMLODIPINE BESYLATE 5 MG PO TABS: 5.0000 mg | ORAL_TABLET | Freq: Every day | ORAL | Status: DC

## 2014-08-04 MED ORDER — METFORMIN HCL 500 MG PO TABS: 500.0000 mg | ORAL_TABLET | Freq: Every day | ORAL | Status: DC

## 2014-08-04 MED ORDER — BENAZEPRIL HCL 40 MG PO TABS: 40.0000 mg | ORAL_TABLET | Freq: Every day | ORAL | Status: DC

## 2014-08-04 MED ORDER — ATORVASTATIN CALCIUM 20 MG PO TABS: 20.0000 mg | ORAL_TABLET | Freq: Every day | ORAL | Status: DC

## 2014-08-04 NOTE — Patient Instructions (Addendum)
You should receive a call or letter about your lab results within the next week to 10 days.  See below on menopause and possible treatments, but I will check thyroid test for the hot flashes. Discussed the symptoms and treatment options with her OB/GYN at upcoming visit.   Menopause and Herbal Products Menopause is the normal time of life when menstrual periods stop completely. Menopause is complete when you have missed 12 consecutive menstrual periods. It usually occurs between the ages of 27 to 31, with an average age of 98. Very rarely does a woman develop menopause before 50 years old. At menopause, your ovaries stop producing the female hormones, estrogen and progesterone. This can cause undesirable symptoms and also affect your health. Sometimes the symptoms can occur 4 to 5 years before the menopause begins. There is no relationship between menopause and: 1. Oral contraceptives. 2. Number of children you had. 3. Race. 4. The age your menstrual periods started (menarche). Heavy smokers and very thin women may develop menopause earlier in life. Estrogen and progesterone hormone treatment is the usual method of treating menopausal symptoms. However, there are women who should not take hormone treatment. This is true of:  1. Women that have breast or uterine cancer. 2. Women who prefer not to take hormones because of certain side effects (abnormal uterine bleeding). 3. Women who are afraid that hormones may cause breast cancer. 4. Women who have a history of liver disease, heart disease, stroke, or blood clots. For these women, there are other medications that may help treat their menopausal symptoms. These medications are found in plants and botanical products. They can be found in the form of herbs, teas, oils, tinctures, and pills.  CAUSES: 1. The ovaries stop producing the female hormones estrogen and progesterone. 2. Other causes include: 1. Surgery to remove both ovaries. 2. The ovaries  stop functioning for no know reason. 3. Tumors of the pituitary gland in the brain. 4. Medical disease that affects the ovaries and hormone production. 5. Radiation treatment to the abdomen or pelvis. 6. Chemotherapy that affects the ovaries. PHYTOESTROGENS: Phytoestrogens occur naturally in plants and plant products. They act like estrogen in the body. Herbal medications are made from these plants and botanical steroids. There are 3 types of phytoestrogens: 1. Isoflavones (genistein and daidzein) are found in soy, garbanzo beans, miso and tofu foods. 2. Ligins are found in the shell of seeds. They are used to make oils like flaxseed oil. The bacteria in your intestine act on these foods to produce the estrogen-like hormones. 3. Coumestans are estrogen-like. Some of the foods they are found in include sunflower seeds and bean sprouts. CONDITIONS AND THEIR POSSIBLE HERBAL TREATMENT:  Hot flashes and night sweats.  Soy, black cohosh and evening primrose.  Irritability, insomnia, depression and memory problems.  Chasteberry, ginseng, and soy.  St. John's wort may be helpful for depression. However, there is a concern of it causing cataracts of the eye and may have bad effects on other medications. St. John's wort should not be taken for long time and without your caregiver's advice.  Loss of libido and vaginal and skin dryness.  Wild yam and soy.  Prevention of coronary heart disease and osteoporosis.  Soy and Isoflavones. Several studies have shown that some women benefit from herbal medications, but most of the studies have not consistently shown that these supplements are much better than placebo. Other forms of treatment to help women with menopausal symptoms include a balanced diet, rest, exercise,  vitamin and calcium (with vitamin D) supplements, acupuncture, and group therapy when necessary. THOSE WHO SHOULD NOT TAKE HERBAL MEDICATIONS INCLUDE:  Women who are planning on getting  pregnant unless told by your caregiver.  Women who are breastfeeding unless told by your caregiver.  Women who are taking other prescription medications unless told by your caregiver.  Infants, children, and elderly women unless told by your caregiver. Different herbal medications have different and unmeasured amounts of the herbal ingredients. There are no regulations, quality control, and standardization of the ingredients in herbal medications. Therefore, the amount of the ingredient in the medication may vary from one herb, pill, tea, oil or tincture to another. Many herbal medications can cause serious problems and can even have poisonous effects if taken too much or too long. If problems develop, the medication should be stopped and recorded by your caregiver. HOME CARE INSTRUCTIONS  Do not take or give children herbal medications without your caregiver's advice.  Let your caregiver know all the medications you are taking. This includes prescription, over-the-counter, eye drops, and creams.  Do not take herbal medications longer or more than recommended.  Tell your caregiver about any side effects from the medication. SEEK MEDICAL CARE IF:  You develop a fever of 102 F (38.9 C), or as directed by your caregiver.  You feel sick to your stomach (nauseous), vomit, or have diarrhea.  You develop a rash.  You develop abdominal pain.  You develop severe headaches.  You start to have vision problems.  You feel dizzy or faint.  You start to feel numbness in any part of your body.  You start shaking (have convulsions). Document Released: 07/08/2007 Document Revised: 01/06/2012 Document Reviewed: 02/04/2010 [facility] Patient Information 2015 [place], [place]. This information is not intended to replace advice given to you by your health care provider. Make sure you discuss any questions you have with your health care provider.    Diabetes and Standards of Medical Care Diabetes  is complicated. You may find that your diabetes team includes a dietitian, nurse, diabetes educator, eye doctor, and more. To help everyone know what is going on and to help you get the care you deserve, the following schedule of care was developed to help keep you on track. Below are the tests, exams, vaccines, medicines, education, and plans you will need. HbA1c test This test shows how well you have controlled your glucose over the past 2-3 months. It is used to see if your diabetes management plan needs to be adjusted.  5. It is performed at least 2 times a year if you are meeting treatment goals. 6. It is performed 4 times a year if therapy has changed or if you are not meeting treatment goals. Blood pressure test 5. This test is performed at every routine medical visit. The goal is less than 140/90 mm Hg for most people, but 130/80 mm Hg in some cases. Ask your health care provider about your goal. Dental exam 3. Follow up with the dentist regularly. Eye exam 4. If you are diagnosed with type 1 diabetes as a child, get an exam upon reaching the age of 74 years or older and have had diabetes for 3-5 years. Yearly eye exams are recommended after that initial eye exam. 5. If you are diagnosed with type 1 diabetes as an adult, get an exam within 5 years of diagnosis and then yearly. 6. If you are diagnosed with type 2 diabetes, get an exam as soon as possible after  the diagnosis and then yearly. Foot care exam  Visual foot exams are performed at every routine medical visit. The exams check for cuts, injuries, or other problems with the feet.  A comprehensive foot exam should be done yearly. This includes visual inspection as well as assessing foot pulses and testing for loss of sensation.  Check your feet nightly for cuts, injuries, or other problems with your feet. Tell your health care provider if anything is not healing. Kidney function test (urine microalbumin)  This test is performed  once a year.  Type 1 diabetes: The first test is performed 5 years after diagnosis.  Type 2 diabetes: The first test is performed at the time of diagnosis.  A serum creatinine and estimated glomerular filtration rate (eGFR) test is done once a year to assess the level of chronic kidney disease (CKD), if present. Lipid profile (cholesterol, HDL, LDL, triglycerides)  Performed every 5 years for most people.  The goal for LDL is less than 100 mg/dL. If you are at high risk, the goal is less than 70 mg/dL.  The goal for HDL is 40 mg/dL-50 mg/dL for men and 50 mg/dL-60 mg/dL for women. An HDL cholesterol of 60 mg/dL or higher gives some protection against heart disease.  The goal for triglycerides is less than 150 mg/dL. Influenza vaccine, pneumococcal vaccine, and hepatitis B vaccine  The influenza vaccine is recommended yearly.  It is recommended that people with diabetes who are over 9 years old get the pneumonia vaccine. In some cases, two separate shots may be given. Ask your health care provider if your pneumonia vaccination is up to date.  The hepatitis B vaccine is also recommended for adults with diabetes. Diabetes self-management education  Education is recommended at diagnosis and ongoing as needed. Treatment plan  Your treatment plan is reviewed at every medical visit. Document Released: 11/16/2008 Document Revised: 06/05/2013 Document Reviewed: 06/21/2012 [facility] Patient Information 2015 [place], [place]. This information is not intended to replace advice given to you by your health care provider. Make sure you discuss any questions you have with your health care provider.  Keeping You Healthy  Get These Tests  Blood Pressure- Have your blood pressure checked by your healthcare provider at least once a year.  Normal blood pressure is 120/80.  Weight- Have your body mass index (BMI) calculated to screen for obesity.  BMI is a measure of body fat based on height and  weight.  You can calculate your own BMI at [employer]  Cholesterol- Have your cholesterol checked every year.  Diabetes- Have your blood sugar checked every year if you have high blood pressure, high cholesterol, a family history of diabetes or if you are overweight.  Pap Test - Have a pap test every 1 to 5 years if you have been sexually active.  If you are older than 65 and recent pap tests have been normal you may not need additional pap tests.  In addition, if you have had a hysterectomy  for benign disease additional pap tests are not necessary.  Mammogram-Yearly mammograms are essential for early detection of breast cancer  Screening for Colon Cancer- Colonoscopy starting at age 44. Screening may begin sooner depending on your family history and other health conditions.  Follow up colonoscopy as directed by your Gastroenterologist.  Screening for Osteoporosis- Screening begins at age 38 with bone density scanning, sooner if you are at higher risk for developing Osteoporosis.  Get these medicines  Calcium with Vitamin D- Your body  requires 1200-1500 mg of Calcium a day and [phone] IU of Vitamin D a day.  You can only absorb 500 mg of Calcium at a time therefore Calcium must be taken in 2 or 3 separate doses throughout the day.  Hormones- Hormone therapy has been associated with increased risk for certain cancers and heart disease.  Talk to your healthcare provider about if you need relief from menopausal symptoms.  Aspirin- Ask your healthcare provider about taking Aspirin to prevent Heart Disease and Stroke.  Get these Immuniztions  Flu shot- Every fall  Pneumonia shot- Once after the age of 57; if you are younger ask your healthcare provider if you need a pneumonia shot.  Tetanus- Every ten years.  Zostavax- Once after the age of 32 to prevent shingles.  Take these steps  Don't smoke- Your healthcare provider can help you quit. For tips on how to quit, ask  your healthcare provider or go to www.smokefree.gov or call 1-800 QUIT-NOW.  Be physically active- Exercise 5 days a week for a minimum of 30 minutes.  If you are not already physically active, start slow and gradually work up to 30 minutes of moderate physical activity.  Try walking, dancing, bike riding, swimming, etc.  Eat a healthy diet- Eat a variety of healthy foods such as fruits, vegetables, whole grains, low fat milk, low fat cheeses, yogurt, lean meats, chicken, fish, eggs, dried beans, tofu, etc.  For more information go to www.thenutritionsource.org  Dental visit- Brush and floss teeth twice daily; visit your dentist twice a year.  Eye exam- Visit your Optometrist or Ophthalmologist yearly.  Drink alcohol in moderation- Limit alcohol intake to one drink or less a day.  Never drink and drive.  Depression- Your emotional health is as important as your physical health.  If you're feeling down or losing interest in things you normally enjoy, please talk to your healthcare provider.  Seat Belts- can save your life; always wear one  Smoke/Carbon Monoxide detectors- These detectors need to be installed on the appropriate level of your home.  Replace batteries at least once a year.  Violence- If anyone is threatening or hurting you, please tell your healthcare provider.  Living Will/ Health care power of attorney- Discuss with your healthcare provider and family.

## 2014-08-04 NOTE — Progress Notes (Addendum)
Subjective:  This chart was scribed for Merri Ray, MD by Thea Alken, ED Scribe. This patient was seen in room 1 and the patient's care was started at 10:06 AM.  Patient ID: Robin Delacruz, female    DOB: 14-Nov-1965, 49 y.o.   MRN: [id number]  HPI  Chief Complaint  Patient presents with  . Annual Exam    no pap   . Headache  . Medication Refill    benazepril, atrovastatin, amlodipine   . Labs Only    pa is a diabetic. follow up blood work   . Hypertension    178/100   HPI Comments: Robin Delacruz is a 49 y.o. female who presents to the [facility] for an annual exam and medication refill. Last visit with me was 2 years ago but most recently seen by Dr. Linna Darner in february for acute illness. She has not had diabatic check here since 2014.   Diabetes  Pt has been taking metformin once a day but did not take medication this morning due to not knowing whether to take medication prior to having a physical. Pt is fasting. She has changed her diet and has cut out all starches and red meats. She has been drinking smoothies in the morning followed by salad with chicken for lunch. Pt states she feels more energized since making healthier choices. Pt has been exercising daily at the gym. 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 Pt has been seen by a dentist within the past year but states she is due to for another appointment. She has also been seen by optometrist recently as well.   Lab Results  Component Value Date   HGBA1C 6.1 12/13/2012    Hypertension Pt has been taking Lotensin 40 mg and Norvasc 5 mg once a day but did not take medication today. Pt checks BP at home which usually run 120/80 and 135/40 on her more stressful days.  Hyperlipidemia Pt has been taking Lipitor 39m once a day.   Lab Results  Component Value Date   CHOL 102 07/26/2012   HDL 37* 07/26/2012   LDLCALC 41 07/26/2012   TRIG 119 07/26/2012   CHOLHDL 2.8  07/26/2012   Anemia   Pt is still taking iron once a day which she purchases OTC. She has hx of anemia due to heavy menstruation which she's had surgery for.   Cancer screening Pt has not had colonoscopy. She denies hx of colon cancer.  She has mammogram yearly. She is UTD with pap smear which has been normal.   Depression screening Pt states she is doing well. She denies depression.   Depression screen PHQ 2/9 08/04/2014  Decreased Interest 0  Down, Depressed, Hopeless 0  PHQ - 2 Score 0   Immunizations Pt states her job is requiring Hep B series and reports series is given at her job.  Immunization History  Administered Date(s) Administered  . Influenza Split 10/29/2011  . Influenza,inj,Quad PF,36+ Mos 12/13/2012  . Pneumococcal Polysaccharide-23 12/15/2012   Hormonal changes Pt reports pre menopausal symptoms. She reports intermittent hot flashes and intermittent night sweats. Pt states her mother went through menopause in her 666's She plans to schedule an appointment with her OBGYN.   She denies CP, difficulty breathing, SOB, arthralgia, and myalgias  Pt has hx of hyperthyroidism including her sister and her daughter. She denies trouble losing weight.   Patient Active Problem List   Diagnosis Date Noted  .  Type 2 diabetes mellitus without complication 40/98/1191  . Ovarian cyst   . Anemia   . Dyspnea   . Fatigue   . Chest pain 11/02/2011  . Hypertension    Past Medical History  Diagnosis Date  . Hypertension   . Diabetes mellitus     Diagnosed at age 49  . Chest pain   . Fatigue   . Dyspnea   . Anemia   . Ovarian cyst    Past Surgical History  Procedure Laterality Date  . Cesarean section    . Tonsillectomy     Allergies  Allergen Reactions  . Penicillins Anaphylaxis and Hives  . Sulfa Antibiotics Anaphylaxis and Hives   Prior to Admission medications   Medication Sig Start Date End Date Taking? Authorizing Provider  albuterol (PROVENTIL  HFA;VENTOLIN HFA) 108 (90 BASE) MCG/ACT inhaler Inhale 2 puffs into the lungs every 4 (four) hours as needed for wheezing or shortness of breath (cough, shortness of breath or wheezing.). 03/05/14  Yes Posey Boyer, MD  amLODipine (NORVASC) 5 MG tablet TAKE 1 TABLET BY MOUTH EVERY DAY (INCREASE TO (2) TABLETS DAILY AS DIRECTED) 03/05/14  Yes Posey Boyer, MD  atorvastatin (LIPITOR) 20 MG tablet Take 1 tablet (20 mg total) by mouth daily. 03/29/14  Yes Posey Boyer, MD  benazepril (LOTENSIN) 40 MG tablet Take 1 tablet (40 mg total) by mouth every morning. PATIENT NEEDS OFFICE VISIT FOR ADDITIONAL REFILLS 03/05/14  Yes Posey Boyer, MD  benazepril (LOTENSIN) 40 MG tablet Take 1 tablet (40 mg total) by mouth daily. 03/28/14  Yes Posey Boyer, MD  ferrous sulfate 325 (65 FE) MG tablet Take 1 tablet (325 mg total) by mouth daily with breakfast. 10/29/11  Yes Wendie Agreste, MD  metFORMIN (GLUCOPHAGE) 500 MG tablet Take 1 tablet (500 mg total) by mouth 2 (two) times daily with a meal. 03/05/14  Yes Posey Boyer, MD  aspirin EC 81 MG tablet Take 1 tablet (81 mg total) by mouth daily. Patient not taking: Reported on 08/04/2014 11/02/11   Lelon Perla, MD  benzonatate (TESSALON) 100 MG capsule Take 1-2 capsules (100-200 mg total) by mouth 3 (three) times daily as needed. Patient not taking: Reported on 08/04/2014 03/05/14   Posey Boyer, MD   History   Social History  . Marital Status: Divorced    Spouse Name: N/A  . Number of Children: 2  . Years of Education: N/A   Occupational History  . [employer]   Social History Main Topics  . Smoking status: Never Smoker   . Smokeless tobacco: Never Used  . Alcohol Use: No  . Drug Use: No  . Sexual Activity: No   Other Topics Concern  . Not on file   Social History Narrative   Review of Systems  Constitutional: Positive for diaphoresis. Negative for fatigue.  Respiratory: Negative for apnea, shortness of breath and wheezing.     Cardiovascular: Negative for chest pain.  Musculoskeletal: Negative for myalgias and arthralgias.  Neurological: Positive for headaches.  Psychiatric/Behavioral: Negative for dysphoric mood. The patient is not nervous/anxious.    13 point ROS per health survey reviewed  Positive for Headache and sweating as dicussed with hot flashes as above. Otherwise negative   Objective:   Physical Exam  Constitutional: She is oriented to person, place, and time. She appears well-developed and well-nourished. No distress.  HENT:  Head: Normocephalic and atraumatic.  Right Ear: Hearing, tympanic membrane,  external ear and ear canal normal.  Left Ear: Hearing, tympanic membrane, external ear and ear canal normal.  Mouth/Throat: Uvula is midline, oropharynx is clear and moist and mucous membranes are normal.  Eyes: Conjunctivae and EOM are normal. Pupils are equal, round, and reactive to light. No scleral icterus.  Neck: Neck supple. No thyromegaly present.  Cardiovascular: Normal rate, regular rhythm and normal heart sounds.   Pulmonary/Chest: Effort normal and breath sounds normal.  Musculoskeletal: Normal range of motion.  Neurological: She is alert and oriented to person, place, and time.  Skin: Skin is warm and dry.  Psychiatric: She has a normal mood and affect. Her behavior is normal.  Nursing note and vitals reviewed.  Filed Vitals:   08/04/14 0905  BP: 178/100  Pulse: 72  Temp: 98.4 F (36.9 C)  TempSrc: Oral  Resp: 17  Height: 5' 6"  (1.676 m)  Weight: 200 lb 6.4 oz (90.901 kg)  SpO2: 99%   Results for orders placed or performed in visit on 08/04/14  POCT glucose (manual entry)  Result Value Ref Range   POC Glucose 157 (A) 70 - 99 mg/dl  POCT CBC  Result Value Ref Range   WBC 5.9 4.6 - 10.2 K/uL   Lymph, poc 2.0 0.6 - 3.4   POC LYMPH PERCENT 34.5 10 - 50 %L   MID (cbc) 0.5 0 - 0.9   POC MID % 9.0 0 - 12 %M   POC Granulocyte 3.3 2 - 6.9   Granulocyte percent 56.5 37 - 80 %G    RBC 5.33 4.04 - 5.48 M/uL   Hemoglobin 12.9 12.2 - 16.2 g/dL   HCT, POC 40.9 37.7 - 47.9 %   MCV 76.8 (A) 80 - 97 fL   MCH, POC 24.2 (A) 27 - 31.2 pg   MCHC 31.5 (A) 31.8 - 35.4 g/dL   RDW, POC 14.5 %   Platelet Count, POC 273 142 - 424 K/uL   MPV 8.3 0 - 99.8 fL   Assessment & Plan:   Robin Delacruz is a 49 y.o. female Annual physical exam  --anticipatory guidance as below in AVS, screening labs above. Health maintenance items as above in HPI discussed/recommended as applicable. Continue routine follow-up with OB/GYN.  Type 2 diabetes mellitus without complication - Plan: POCT glucose (manual entry), Microalbumin, urine, Hemoglobin A1c, metFORMIN (GLUCOPHAGE) 500 MG tablet  -Prior controlled, will check A1c. Continue same dose metformin 500 mg daily.  Essential hypertension - Plan: Lipid panel, Comprehensive metabolic panel, benazepril (LOTENSIN) 40 MG tablet, amLODipine (NORVASC) 5 MG tablet  -Elevated in office this has not taken medication today. Advised she can take meds in the future even for fasting lab visits. Home numbers under control, continue same doses.    Hyperlipidemia - Plan: atorvastatin (LIPITOR) 20 MG tablet  -Tolerating Lipitor, continue same dose.  Lipid panel pending.  Anemia, unspecified anemia type - Plan: POCT CBC  -Normal hemoglobin today. Okay continue same dose of iron, continue routine follow-up with OB/GYN.  Hot flushes, perimenopausal - Plan: TSH  -Over-the-counter treatments discussed, and handout given. Can discuss with upcoming visit to OB/GYN for other management options.  We'll check TSH, with family history of thyroid disease, but suspect perimenopausal flushing.  Screening for hypothyroidism, Family history of hypothyroidism - Plan: TSH  Screen for colon cancer - Plan: Ambulatory referral to Gastroenterology     Meds ordered this encounter  Medications  . metFORMIN (GLUCOPHAGE) 500 MG tablet    Sig: Take 1 tablet (500  mg total) by  mouth daily with breakfast.    Dispense:  90 tablet    Refill:  1  . benazepril (LOTENSIN) 40 MG tablet    Sig: Take 1 tablet (40 mg total) by mouth daily.    Dispense:  90 tablet    Refill:  1  . atorvastatin (LIPITOR) 20 MG tablet    Sig: Take 1 tablet (20 mg total) by mouth daily.    Dispense:  90 tablet    Refill:  1  . amLODipine (NORVASC) 5 MG tablet    Sig: Take 1 tablet (5 mg total) by mouth daily.    Dispense:  90 tablet    Refill:  1   Patient Instructions  You should receive a call or letter about your lab results within the next week to 10 days.  See below on menopause and possible treatments, but I will check thyroid test for the hot flashes. Discussed the symptoms and treatment options with her OB/GYN at upcoming visit.   Menopause and Herbal Products Menopause is the normal time of life when menstrual periods stop completely. Menopause is complete when you have missed 12 consecutive menstrual periods. It usually occurs between the ages of 66 to 50, with an average age of 23. Very rarely does a woman develop menopause before 50 years old. At menopause, your ovaries stop producing the female hormones, estrogen and progesterone. This can cause undesirable symptoms and also affect your health. Sometimes the symptoms can occur 4 to 5 years before the menopause begins. There is no relationship between menopause and: 1. Oral contraceptives. 2. Number of children you had. 3. Race. 4. The age your menstrual periods started (menarche). Heavy smokers and very thin women may develop menopause earlier in life. Estrogen and progesterone hormone treatment is the usual method of treating menopausal symptoms. However, there are women who should not take hormone treatment. This is true of:  1. Women that have breast or uterine cancer. 2. Women who prefer not to take hormones because of certain side effects (abnormal uterine bleeding). 3. Women who are afraid that hormones may cause breast  cancer. 4. Women who have a history of liver disease, heart disease, stroke, or blood clots. For these women, there are other medications that may help treat their menopausal symptoms. These medications are found in plants and botanical products. They can be found in the form of herbs, teas, oils, tinctures, and pills.  CAUSES: 1. The ovaries stop producing the female hormones estrogen and progesterone. 2. Other causes include: 1. Surgery to remove both ovaries. 2. The ovaries stop functioning for no know reason. 3. Tumors of the pituitary gland in the brain. 4. Medical disease that affects the ovaries and hormone production. 5. Radiation treatment to the abdomen or pelvis. 6. Chemotherapy that affects the ovaries. PHYTOESTROGENS: Phytoestrogens occur naturally in plants and plant products. They act like estrogen in the body. Herbal medications are made from these plants and botanical steroids. There are 3 types of phytoestrogens: 1. Isoflavones (genistein and daidzein) are found in soy, garbanzo beans, miso and tofu foods. 2. Ligins are found in the shell of seeds. They are used to make oils like flaxseed oil. The bacteria in your intestine act on these foods to produce the estrogen-like hormones. 3. Coumestans are estrogen-like. Some of the foods they are found in include sunflower seeds and bean sprouts. CONDITIONS AND THEIR POSSIBLE HERBAL TREATMENT:  Hot flashes and night sweats.  Soy, black cohosh and evening primrose.  Irritability, insomnia, depression and memory problems.  Chasteberry, ginseng, and soy.  St. John's wort may be helpful for depression. However, there is a concern of it causing cataracts of the eye and may have bad effects on other medications. St. John's wort should not be taken for long time and without your caregiver's advice.  Loss of libido and vaginal and skin dryness.  Wild yam and soy.  Prevention of coronary heart disease and osteoporosis.  Soy and  Isoflavones. Several studies have shown that some women benefit from herbal medications, but most of the studies have not consistently shown that these supplements are much better than placebo. Other forms of treatment to help women with menopausal symptoms include a balanced diet, rest, exercise, vitamin and calcium (with vitamin D) supplements, acupuncture, and group therapy when necessary. THOSE WHO SHOULD NOT TAKE HERBAL MEDICATIONS INCLUDE:  Women who are planning on getting pregnant unless told by your caregiver.  Women who are breastfeeding unless told by your caregiver.  Women who are taking other prescription medications unless told by your caregiver.  Infants, children, and elderly women unless told by your caregiver. Different herbal medications have different and unmeasured amounts of the herbal ingredients. There are no regulations, quality control, and standardization of the ingredients in herbal medications. Therefore, the amount of the ingredient in the medication may vary from one herb, pill, tea, oil or tincture to another. Many herbal medications can cause serious problems and can even have poisonous effects if taken too much or too long. If problems develop, the medication should be stopped and recorded by your caregiver. HOME CARE INSTRUCTIONS  Do not take or give children herbal medications without your caregiver's advice.  Let your caregiver know all the medications you are taking. This includes prescription, over-the-counter, eye drops, and creams.  Do not take herbal medications longer or more than recommended.  Tell your caregiver about any side effects from the medication. SEEK MEDICAL CARE IF:  You develop a fever of 102 F (38.9 C), or as directed by your caregiver.  You feel sick to your stomach (nauseous), vomit, or have diarrhea.  You develop a rash.  You develop abdominal pain.  You develop severe headaches.  You start to have vision problems.  You  feel dizzy or faint.  You start to feel numbness in any part of your body.  You start shaking (have convulsions). Document Released: 07/08/2007 Document Revised: 01/06/2012 Document Reviewed: 02/04/2010 [facility] Patient Information 2015 [place], [place]. This information is not intended to replace advice given to you by your health care provider. Make sure you discuss any questions you have with your health care provider.    Diabetes and Standards of Medical Care Diabetes is complicated. You may find that your diabetes team includes a dietitian, nurse, diabetes educator, eye doctor, and more. To help everyone know what is going on and to help you get the care you deserve, the following schedule of care was developed to help keep you on track. Below are the tests, exams, vaccines, medicines, education, and plans you will need. HbA1c test This test shows how well you have controlled your glucose over the past 2-3 months. It is used to see if your diabetes management plan needs to be adjusted.  5. It is performed at least 2 times a year if you are meeting treatment goals. 6. It is performed 4 times a year if therapy has changed or if you are not meeting treatment goals. Blood pressure test 5. This test  is performed at every routine medical visit. The goal is less than 140/90 mm Hg for most people, but 130/80 mm Hg in some cases. Ask your health care provider about your goal. Dental exam 3. Follow up with the dentist regularly. Eye exam 4. If you are diagnosed with type 1 diabetes as a child, get an exam upon reaching the age of 68 years or older and have had diabetes for 3-5 years. Yearly eye exams are recommended after that initial eye exam. 5. If you are diagnosed with type 1 diabetes as an adult, get an exam within 5 years of diagnosis and then yearly. 6. If you are diagnosed with type 2 diabetes, get an exam as soon as possible after the diagnosis and then yearly. Foot care exam  Visual  foot exams are performed at every routine medical visit. The exams check for cuts, injuries, or other problems with the feet.  A comprehensive foot exam should be done yearly. This includes visual inspection as well as assessing foot pulses and testing for loss of sensation.  Check your feet nightly for cuts, injuries, or other problems with your feet. Tell your health care provider if anything is not healing. Kidney function test (urine microalbumin)  This test is performed once a year.  Type 1 diabetes: The first test is performed 5 years after diagnosis.  Type 2 diabetes: The first test is performed at the time of diagnosis.  A serum creatinine and estimated glomerular filtration rate (eGFR) test is done once a year to assess the level of chronic kidney disease (CKD), if present. Lipid profile (cholesterol, HDL, LDL, triglycerides)  Performed every 5 years for most people.  The goal for LDL is less than 100 mg/dL. If you are at high risk, the goal is less than 70 mg/dL.  The goal for HDL is 40 mg/dL-50 mg/dL for men and 50 mg/dL-60 mg/dL for women. An HDL cholesterol of 60 mg/dL or higher gives some protection against heart disease.  The goal for triglycerides is less than 150 mg/dL. Influenza vaccine, pneumococcal vaccine, and hepatitis B vaccine  The influenza vaccine is recommended yearly.  It is recommended that people with diabetes who are over 47 years old get the pneumonia vaccine. In some cases, two separate shots may be given. Ask your health care provider if your pneumonia vaccination is up to date.  The hepatitis B vaccine is also recommended for adults with diabetes. Diabetes self-management education  Education is recommended at diagnosis and ongoing as needed. Treatment plan  Your treatment plan is reviewed at every medical visit. Document Released: 11/16/2008 Document Revised: 06/05/2013 Document Reviewed: 06/21/2012 [facility] Patient Information 2015 [place],  [place]. This information is not intended to replace advice given to you by your health care provider. Make sure you discuss any questions you have with your health care provider.  Keeping You Healthy  Get These Tests  Blood Pressure- Have your blood pressure checked by your healthcare provider at least once a year.  Normal blood pressure is 120/80.  Weight- Have your body mass index (BMI) calculated to screen for obesity.  BMI is a measure of body fat based on height and weight.  You can calculate your own BMI at [employer]  Cholesterol- Have your cholesterol checked every year.  Diabetes- Have your blood sugar checked every year if you have high blood pressure, high cholesterol, a family history of diabetes or if you are overweight.  Pap Test - Have a pap test every 1 to  5 years if you have been sexually active.  If you are older than 65 and recent pap tests have been normal you may not need additional pap tests.  In addition, if you have had a hysterectomy  for benign disease additional pap tests are not necessary.  Mammogram-Yearly mammograms are essential for early detection of breast cancer  Screening for Colon Cancer- Colonoscopy starting at age 75. Screening may begin sooner depending on your family history and other health conditions.  Follow up colonoscopy as directed by your Gastroenterologist.  Screening for Osteoporosis- Screening begins at age 56 with bone density scanning, sooner if you are at higher risk for developing Osteoporosis.  Get these medicines  Calcium with Vitamin D- Your body requires 1200-1500 mg of Calcium a day and [phone] IU of Vitamin D a day.  You can only absorb 500 mg of Calcium at a time therefore Calcium must be taken in 2 or 3 separate doses throughout the day.  Hormones- Hormone therapy has been associated with increased risk for certain cancers and heart disease.  Talk to your healthcare provider about if you need relief from menopausal  symptoms.  Aspirin- Ask your healthcare provider about taking Aspirin to prevent Heart Disease and Stroke.  Get these Immuniztions  Flu shot- Every fall  Pneumonia shot- Once after the age of 28; if you are younger ask your healthcare provider if you need a pneumonia shot.  Tetanus- Every ten years.  Zostavax- Once after the age of 13 to prevent shingles.  Take these steps  Don't smoke- Your healthcare provider can help you quit. For tips on how to quit, ask your healthcare provider or go to www.smokefree.gov or call 1-800 QUIT-NOW.  Be physically active- Exercise 5 days a week for a minimum of 30 minutes.  If you are not already physically active, start slow and gradually work up to 30 minutes of moderate physical activity.  Try walking, dancing, bike riding, swimming, etc.  Eat a healthy diet- Eat a variety of healthy foods such as fruits, vegetables, whole grains, low fat milk, low fat cheeses, yogurt, lean meats, chicken, fish, eggs, dried beans, tofu, etc.  For more information go to www.thenutritionsource.org  Dental visit- Brush and floss teeth twice daily; visit your dentist twice a year.  Eye exam- Visit your Optometrist or Ophthalmologist yearly.  Drink alcohol in moderation- Limit alcohol intake to one drink or less a day.  Never drink and drive.  Depression- Your emotional health is as important as your physical health.  If you're feeling down or losing interest in things you normally enjoy, please talk to your healthcare provider.  Seat Belts- can save your life; always wear one  Smoke/Carbon Monoxide detectors- These detectors need to be installed on the appropriate level of your home.  Replace batteries at least once a year.  Violence- If anyone is threatening or hurting you, please tell your healthcare provider.  Living Will/ Health care power of attorney- Discuss with your healthcare provider and family.    I personally performed the services described in this  documentation, which was scribed in my presence. The recorded information has been reviewed and considered, and addended by me as needed.

## 2014-10-22 ENCOUNTER — Telehealth: Payer: Self-pay | Admitting: Family Medicine

## 2014-10-22 NOTE — Telephone Encounter (Signed)
lmom to call back and reschedule her appt that she had with Dr [name] that was on 11-05-14

## 2014-11-05 ENCOUNTER — Ambulatory Visit: Payer: Self-pay | Admitting: Family Medicine

## 2015-01-27 ENCOUNTER — Other Ambulatory Visit: Payer: Self-pay | Admitting: Family Medicine

## 2015-05-14 ENCOUNTER — Ambulatory Visit (INDEPENDENT_AMBULATORY_CARE_PROVIDER_SITE_OTHER): Payer: BC Managed Care – PPO | Admitting: Family Medicine

## 2015-05-14 VITALS — BP 152/96 | HR 86 | Temp 99.1°F | Resp 18 | Ht 65.0 in | Wt 200.0 lb

## 2015-05-14 DIAGNOSIS — E785 Hyperlipidemia, unspecified: Secondary | ICD-10-CM

## 2015-05-14 DIAGNOSIS — E119 Type 2 diabetes mellitus without complications: Secondary | ICD-10-CM | POA: Diagnosis not present

## 2015-05-14 DIAGNOSIS — I10 Essential (primary) hypertension: Secondary | ICD-10-CM

## 2015-05-14 DIAGNOSIS — Z8249 Family history of ischemic heart disease and other diseases of the circulatory system: Secondary | ICD-10-CM | POA: Diagnosis not present

## 2015-05-14 LAB — GLUCOSE, POCT (MANUAL RESULT ENTRY): POC Glucose: 205 mg/dl — AB (ref 70–99)

## 2015-05-14 LAB — POCT GLYCOSYLATED HEMOGLOBIN (HGB A1C): Hemoglobin A1C: 6.8

## 2015-05-14 MED ORDER — BENAZEPRIL HCL 40 MG PO TABS: ORAL_TABLET | ORAL | Status: DC

## 2015-05-14 MED ORDER — METFORMIN HCL 500 MG PO TABS: 500.0000 mg | ORAL_TABLET | Freq: Every day | ORAL | Status: DC

## 2015-05-14 MED ORDER — AMLODIPINE BESYLATE 5 MG PO TABS: 5.0000 mg | ORAL_TABLET | Freq: Every day | ORAL | Status: DC

## 2015-05-14 MED ORDER — ATORVASTATIN CALCIUM 20 MG PO TABS: 20.0000 mg | ORAL_TABLET | Freq: Every day | ORAL | Status: DC

## 2015-05-14 NOTE — Progress Notes (Signed)
Patient ID: Robin Delacruz, female    DOB: Oct 15, 1965  Age: 50 y.o. MRN: [id number]  Chief Complaint  Patient presents with  . Follow-up    Refills    Subjective:   Patient is diabetic and has high blood pressure and is out of medicines. She is concerned because both parents had heart disease, but it was in their upper years it sounds like. She is well. Review of systems is unremarkable. HEENT normal. She sees an eye doctor yearly, wears glasses. Cardiovascular unremarkable. Respiratory unremarkable. Gastrointestinal and genitourinary unremarkable. Musculoskeletal unremarkable. She does exercise. She has been up since last visit and then lost 15 pounds and is working hard at that. Encouraged her to keep at it.  Current allergies, medications, problem list, past/family and social histories reviewed.  Objective:  BP 152/96 mmHg  Pulse 86  Temp(Src) 99.1 F (37.3 C) (Oral)  Resp 18  Ht 5\' 5"  (1.651 m)  Wt 200 lb (90.719 kg)  BMI 33.28 kg/m2  SpO2 98%  LMP 04/29/2015  No major acute distress. Chest clear. Heart regular without murmurs. Abdomen soft without mass or tenderness. Extremities normal.  Assessment & Plan:   Assessment: 1. Diabetes mellitus without complication (HCC)   2. Essential hypertension   3. Family history of heart disease   4. Type 2 diabetes mellitus without complication, without long-term current use of insulin (HCC)   5. Hyperlipidemia       Plan: Check labs  Orders Placed This Encounter  Procedures  . Comprehensive metabolic panel  . Lipid panel  . POCT glucose (manual entry)  . POCT glycosylated hemoglobin (Hb A1C)   Results for orders placed or performed in visit on 05/14/15  POCT glucose (manual entry)  Result Value Ref Range   POC Glucose 205 (A) 70 - 99 mg/dl  POCT glycosylated hemoglobin (Hb A1C)  Result Value Ref Range   Hemoglobin A1C 6.8     Meds ordered this encounter  Medications  . benazepril (LOTENSIN) 40 MG tablet    Sig:  TAKE 1 TABLET BY MOUTH EVERY DAY    Dispense:  90 tablet    Refill:  3  . metFORMIN (GLUCOPHAGE) 500 MG tablet    Sig: Take 1 tablet (500 mg total) by mouth daily with breakfast.    Dispense:  90 tablet    Refill:  3  . amLODipine (NORVASC) 5 MG tablet    Sig: Take 1 tablet (5 mg total) by mouth daily.    Dispense:  90 tablet    Refill:  3  . atorvastatin (LIPITOR) 20 MG tablet    Sig: Take 1 tablet (20 mg total) by mouth daily.    Dispense:  90 tablet    Refill:  3   Encourage losing a lot of weight still. Long talk about that. Pleased with her A1c. Get back on her medicines.      Patient Instructions   Continue taking your current medications faithfully  I recommend trying to return at about every 4 months, which would be August.  Get regular exercise  Continue to try and watch your dietary intake and lose more weight. Congratulations on losing some.      IF you received an x-ray today, you will receive an invoice from [facility]. Please contact [facility] at [phone] with questions or concerns regarding your invoice.   IF you received labwork today, you will receive an invoice from [facility]. Please contact [facility] at [phone] with questions or concerns  regarding your invoice.   Our billing staff will not be able to assist you with questions regarding bills from these companies.  You will be contacted with the lab results as soon as they are available. The fastest way to get your results is to activate your My Chart account. Instructions are located on the last page of this paperwork. If you have not heard from [place] regarding the results in 2 weeks, please contact this office.         Return in about 4 months (around 09/13/2015).   De Libman, MD 05/14/2015

## 2015-05-14 NOTE — Patient Instructions (Addendum)
Continue taking your current medications faithfully  I recommend trying to return at about every 4 months, which would be August.  Get regular exercise  Continue to try and watch your dietary intake and lose more weight. Congratulations on losing some.      IF you received an x-ray today, you will receive an invoice from [facility]. Please contact [facility] at [phone] with questions or concerns regarding your invoice.   IF you received labwork today, you will receive an invoice from [facility]. Please contact [facility] at [phone] with questions or concerns regarding your invoice.   Our billing staff will not be able to assist you with questions regarding bills from these companies.  You will be contacted with the lab results as soon as they are available. The fastest way to get your results is to activate your My Chart account. Instructions are located on the last page of this paperwork. If you have not heard from us regarding the results in 2 weeks, please contact this office.

## 2015-05-15 LAB — COMPREHENSIVE METABOLIC PANEL
ALT: 11 U/L (ref 6–29)
AST: 14 U/L (ref 10–35)
Albumin: 4.3 g/dL (ref 3.6–5.1)
Alkaline Phosphatase: 66 U/L (ref 33–130)
BUN: 18 mg/dL (ref 7–25)
CO2: 27 mmol/L (ref 20–31)
Calcium: 9.5 mg/dL (ref 8.6–10.4)
Chloride: 103 mmol/L (ref 98–110)
Creat: 0.88 mg/dL (ref 0.50–1.05)
Glucose, Bld: 183 mg/dL — ABNORMAL HIGH (ref 65–99)
Potassium: 4.4 mmol/L (ref 3.5–5.3)
Sodium: 138 mmol/L (ref 135–146)
Total Bilirubin: 0.3 mg/dL (ref 0.2–1.2)
Total Protein: 7.2 g/dL (ref 6.1–8.1)

## 2015-05-15 LAB — LIPID PANEL
Cholesterol: 131 mg/dL (ref 125–200)
HDL: 43 mg/dL — ABNORMAL LOW (ref >=46)
LDL Cholesterol: 65 mg/dL (ref <130)
Total CHOL/HDL Ratio: 3 Ratio (ref <=5.0)
Triglycerides: 117 mg/dL (ref <150)
VLDL: 23 mg/dL (ref <30)

## 2015-05-17 ENCOUNTER — Encounter: Payer: Self-pay | Admitting: *Deleted

## 2015-06-03 ENCOUNTER — Other Ambulatory Visit: Payer: Self-pay | Admitting: Family Medicine

## 2016-03-06 ENCOUNTER — Ambulatory Visit (INDEPENDENT_AMBULATORY_CARE_PROVIDER_SITE_OTHER): Payer: 59 | Admitting: Physician Assistant

## 2016-03-06 VITALS — BP 140/80 | HR 81 | Temp 98.3°F | Resp 18 | Ht 65.0 in | Wt 202.0 lb

## 2016-03-06 DIAGNOSIS — R6889 Other general symptoms and signs: Secondary | ICD-10-CM

## 2016-03-06 MED ORDER — GUAIFENESIN ER 1200 MG PO TB12: 1.0000 | ORAL_TABLET | Freq: Two times a day (BID) | ORAL | 1 refills | Status: DC | PRN

## 2016-03-06 MED ORDER — OSELTAMIVIR PHOSPHATE 75 MG PO CAPS: 75.0000 mg | ORAL_CAPSULE | Freq: Two times a day (BID) | ORAL | 0 refills | Status: DC

## 2016-03-06 MED ORDER — BENZONATATE 100 MG PO CAPS: 100.0000 mg | ORAL_CAPSULE | Freq: Three times a day (TID) | ORAL | 0 refills | Status: DC | PRN

## 2016-03-06 NOTE — Progress Notes (Signed)
[facility] [address], [place] [address] [phone]  Date:  03/06/2016   Name:  Robin Delacruz   DOB:  06/11/1965   MRN:  [id number]  PCP:  Shade FloodGREENE,JEFFREY R, MD    History of Present Illness:  Robin Delacruz is a 51 y.o. female patient who [facility] cc of sore throat, generalized body aches and cough. --sxs began about couple days ago --sorethroat  --bodyaches and malaise --non-productive cough. --not hydrating well --sick contacts with respiratory sxs      Chief Complaint  Patient presents with  . Sore Throat  . Generalized Body Aches  . Cough    Patient Active Problem List   Diagnosis Date Noted  . Type 2 diabetes mellitus without complication (HCC) 03/05/2014  . Ovarian cyst   . Anemia   . Dyspnea   . Fatigue   . Chest pain 11/02/2011  . Hypertension     Past Medical History:  Diagnosis Date  . Anemia   . Chest pain   . Diabetes mellitus    Diagnosed at age 51  . Dyspnea   . Fatigue   . Hypertension   . Ovarian cyst     Past Surgical History:  Procedure Laterality Date  . CESAREAN SECTION    . TONSILLECTOMY      Social History  Substance Use Topics  . Smoking status: Never Smoker  . Smokeless tobacco: Never Used  . Alcohol use No    Family History  Problem Relation Age of Onset  . Coronary artery disease Father     MI at age 52  . Diabetes Father   . Heart disease Father   . Hyperlipidemia Mother   . Thyroid disease Sister   . Thyroid disease Daughter   . Diabetes Maternal Grandmother   . Hypertension Maternal Grandmother   . Diabetes Paternal Grandmother   . Hyperlipidemia Paternal Grandmother   . Diabetes Paternal Grandfather   . Hyperlipidemia Paternal Grandfather     Allergies  Allergen Reactions  . Penicillins Anaphylaxis and Hives  . Sulfa Antibiotics Anaphylaxis and Hives    Medication list has been reviewed and updated.  Current Outpatient Prescriptions on File Prior to  Visit  Medication Sig Dispense Refill  . albuterol (PROVENTIL HFA;VENTOLIN HFA) 108 (90 BASE) MCG/ACT inhaler Inhale 2 puffs into the lungs every 4 (four) hours as needed for wheezing or shortness of breath (cough, shortness of breath or wheezing.). 1 Inhaler 1  . amLODipine (NORVASC) 5 MG tablet Take 1 tablet (5 mg total) by mouth daily. 90 tablet 3  . aspirin EC 81 MG tablet Take 1 tablet (81 mg total) by mouth daily. 90 tablet 3  . atorvastatin (LIPITOR) 20 MG tablet Take 1 tablet (20 mg total) by mouth daily. 90 tablet 3  . benazepril (LOTENSIN) 40 MG tablet TAKE 1 TABLET BY MOUTH EVERY DAY 90 tablet 3  . ferrous sulfate 325 (65 FE) MG tablet Take 1 tablet (325 mg total) by mouth daily with breakfast. 30 tablet 2  . metFORMIN (GLUCOPHAGE) 500 MG tablet Take 1 tablet (500 mg total) by mouth daily with breakfast. 90 tablet 3   No current facility-administered medications on file prior to visit.     ROS ROS otherwise unremarkable unless listed above.   Physical Examination: BP 140/80 (BP Location: Right Arm, Patient Position: Sitting, Cuff Size: Large)   Pulse 81   Temp 98.3 F (36.8 C) (Oral)   Resp 18  Ht 5\' 5"  (1.651 m)   Wt 202 lb (91.6 kg)   LMP 02/07/2016   SpO2 97%   BMI 33.61 kg/m  Ideal Body Weight: Weight in (lb) to have BMI = 25: 149.9  Physical Exam  Constitutional: She is oriented to person, place, and time. She appears well-developed and well-nourished. No distress.  HENT:  Head: Normocephalic and atraumatic.  Right Ear: Tympanic membrane, external ear and ear canal normal.  Left Ear: Tympanic membrane, external ear and ear canal normal.  Nose: Mucosal edema and rhinorrhea present. Right sinus exhibits no maxillary sinus tenderness and no frontal sinus tenderness. Left sinus exhibits no maxillary sinus tenderness and no frontal sinus tenderness.  Mouth/Throat: No uvula swelling. No oropharyngeal exudate, posterior oropharyngeal edema or posterior oropharyngeal  erythema.  Eyes: Conjunctivae and EOM are normal. Pupils are equal, round, and reactive to light.  Cardiovascular: Normal rate and regular rhythm.  Exam reveals no gallop, no distant heart sounds and no friction rub.   No murmur heard. Pulmonary/Chest: Effort normal. No respiratory distress. She has no decreased breath sounds. She has no wheezes. She has no rhonchi.  Lymphadenopathy:       Head (right side): No submandibular, no tonsillar, no preauricular and no posterior auricular adenopathy present.       Head (left side): No submandibular, no tonsillar, no preauricular and no posterior auricular adenopathy present.  Neurological: She is alert and oriented to person, place, and time.  Skin: She is not diaphoretic.  Psychiatric: She has a normal mood and affect. Her behavior is normal.     Assessment and Plan: Robin Delacruz is a 51 y.o. female who is here today for generalized bodyaches, sore throat, and cough. Treat for flu.  Advised anti pyretic use, supportive treatment Flu-like symptoms - Plan: oseltamivir (TAMIFLU) 75 MG capsule, Guaifenesin (MUCINEX MAXIMUM STRENGTH) 1200 MG TB12, benzonatate (TESSALON) 100 MG capsule  Trena Platt, PA-C [facility] 3/18/20185:59 PM

## 2016-03-06 NOTE — Patient Instructions (Addendum)
Please hydrate with 64 oz of water per day. Use the medication as prescribed. Use tylenol or ibuprofen for pain or fever.    Influenza, Adult Influenza ("the flu") is an infection in the lungs, nose, and throat (respiratory tract). It is caused by a virus. The flu causes many common cold symptoms, as well as a high fever and body aches. It can make you feel very sick. The flu spreads easily from person to person (is contagious). Getting a flu shot (influenza vaccination) every year is the best way to prevent the flu. Follow these instructions at home:  Take over-the-counter and prescription medicines only as told by your doctor.  Use a cool mist humidifier to add moisture (humidity) to the air in your home. This can make it easier to breathe.  Rest as needed.  Drink enough fluid to keep your pee (urine) clear or pale yellow.  Cover your mouth and nose when you cough or sneeze.  Wash your hands with soap and water often, especially after you cough or sneeze. If you cannot use soap and water, use hand sanitizer.  Stay home from work or school as told by your doctor. Unless you are visiting your doctor, try to avoid leaving home until your fever has been gone for 24 hours without the use of medicine.  Keep all follow-up visits as told by your doctor. This is important. How is this prevented?  Getting a yearly (annual) flu shot is the best way to avoid getting the flu. You may get the flu shot in late summer, fall, or winter. Ask your doctor when you should get your flu shot.  Wash your hands often or use hand sanitizer often.  Avoid contact with people who are sick during cold and flu season.  Eat healthy foods.  Drink plenty of fluids.  Get enough sleep.  Exercise regularly. Contact a doctor if:  You get new symptoms.  You have:  Chest pain.  Watery poop (diarrhea).  A fever.  Your cough gets worse.  You start to have more mucus.  You feel sick to your stomach  (nauseous).  You throw up (vomit). Get help right away if:  You start to be short of breath or have trouble breathing.  Your skin or nails turn a bluish color.  You have very bad pain or stiffness in your neck.  You get a sudden headache.  You get sudden pain in your face or ear.  You cannot stop throwing up. This information is not intended to replace advice given to you by your health care provider. Make sure you discuss any questions you have with your health care provider. Document Released: 10/29/2007 Document Revised: 06/27/2015 Document Reviewed: 11/13/2014 Elsevier Interactive Patient Education  2017 [employer]    IF you received an x-ray today, you will receive an invoice from [facility]. Please contact [facility] at [phone] with questions or concerns regarding your invoice.   IF you received labwork today, you will receive an invoice from [facility]. Please contact [facility] at [phone] with questions or concerns regarding your invoice.   Our billing staff will not be able to assist you with questions regarding bills from these companies.  You will be contacted with the lab results as soon as they are available. The fastest way to get your results is to activate your My Chart account. Instructions are located on the last page of this paperwork. If you have not heard from us regarding the results in 2 weeks,  please contact this office.

## 2016-04-23 DIAGNOSIS — Z1231 Encounter for screening mammogram for malignant neoplasm of breast: Secondary | ICD-10-CM | POA: Diagnosis not present

## 2016-04-23 LAB — HM MAMMOGRAPHY

## 2016-04-29 DIAGNOSIS — Z01419 Encounter for gynecological examination (general) (routine) without abnormal findings: Secondary | ICD-10-CM | POA: Diagnosis not present

## 2016-05-14 DIAGNOSIS — N924 Excessive bleeding in the premenopausal period: Secondary | ICD-10-CM | POA: Diagnosis not present

## 2016-05-27 ENCOUNTER — Other Ambulatory Visit: Payer: Self-pay | Admitting: Family Medicine

## 2016-05-27 DIAGNOSIS — I10 Essential (primary) hypertension: Secondary | ICD-10-CM

## 2016-06-05 ENCOUNTER — Encounter: Payer: Self-pay | Admitting: Gastroenterology

## 2016-06-05 ENCOUNTER — Ambulatory Visit (INDEPENDENT_AMBULATORY_CARE_PROVIDER_SITE_OTHER): Payer: 59 | Admitting: Family Medicine

## 2016-06-05 ENCOUNTER — Encounter: Payer: Self-pay | Admitting: Family Medicine

## 2016-06-05 VITALS — BP 140/90 | HR 76 | Temp 98.4°F | Resp 16 | Ht 65.5 in | Wt 199.0 lb

## 2016-06-05 DIAGNOSIS — Z23 Encounter for immunization: Secondary | ICD-10-CM | POA: Diagnosis not present

## 2016-06-05 DIAGNOSIS — R062 Wheezing: Secondary | ICD-10-CM | POA: Diagnosis not present

## 2016-06-05 DIAGNOSIS — J069 Acute upper respiratory infection, unspecified: Secondary | ICD-10-CM | POA: Diagnosis not present

## 2016-06-05 DIAGNOSIS — R232 Flushing: Secondary | ICD-10-CM

## 2016-06-05 DIAGNOSIS — R61 Generalized hyperhidrosis: Secondary | ICD-10-CM

## 2016-06-05 DIAGNOSIS — Z0000 Encounter for general adult medical examination without abnormal findings: Secondary | ICD-10-CM

## 2016-06-05 DIAGNOSIS — J209 Acute bronchitis, unspecified: Secondary | ICD-10-CM

## 2016-06-05 DIAGNOSIS — E785 Hyperlipidemia, unspecified: Secondary | ICD-10-CM

## 2016-06-05 DIAGNOSIS — E119 Type 2 diabetes mellitus without complications: Secondary | ICD-10-CM | POA: Diagnosis not present

## 2016-06-05 DIAGNOSIS — I10 Essential (primary) hypertension: Secondary | ICD-10-CM

## 2016-06-05 DIAGNOSIS — Z1211 Encounter for screening for malignant neoplasm of colon: Secondary | ICD-10-CM | POA: Diagnosis not present

## 2016-06-05 MED ORDER — AMLODIPINE BESYLATE 5 MG PO TABS: 5.0000 mg | ORAL_TABLET | Freq: Every day | ORAL | 1 refills | Status: DC

## 2016-06-05 MED ORDER — ATORVASTATIN CALCIUM 20 MG PO TABS: 20.0000 mg | ORAL_TABLET | Freq: Every day | ORAL | 1 refills | Status: DC

## 2016-06-05 MED ORDER — METFORMIN HCL 500 MG PO TABS: 500.0000 mg | ORAL_TABLET | Freq: Every day | ORAL | 1 refills | Status: DC

## 2016-06-05 MED ORDER — ALBUTEROL SULFATE HFA 108 (90 BASE) MCG/ACT IN AERS: 1.0000 | INHALATION_SPRAY | RESPIRATORY_TRACT | 0 refills | Status: DC | PRN

## 2016-06-05 MED ORDER — BENAZEPRIL HCL 40 MG PO TABS: ORAL_TABLET | ORAL | 1 refills | Status: DC

## 2016-06-05 NOTE — Patient Instructions (Addendum)
No change in medications for now, but if your blood pressure remains over 130/80, increase amlodipine to 10 mg each day. If you have swelling in your ankles with that dose, let me know. If you do end up increasing to 2 pills per day, let me know so I can send in the new dose of medication.  I will check thyroid test, blood counts for your night sweats and flushing symptoms, however if those worsen or increase in frequency, return to discuss that further. Continue follow-up with OB/GYN to discuss hormonal cause of the symptoms.  Recheck in 6 months unless A1c is elevated, then would need to see you in 3 months. Let me know if you have questions in the meantime.  Diabetes Mellitus and Standards of Medical Care Managing diabetes (diabetes mellitus) can be complicated. Your diabetes treatment may be managed by a team of health care providers, including:  A diet and nutrition specialist (registered dietitian).  A nurse.  A certified diabetes educator (CDE).  A diabetes specialist (endocrinologist).  An eye doctor.  A primary care provider.  A dentist. Your health care providers follow a schedule in order to help you get the best quality of care. The following schedule is a general guideline for your diabetes management plan. Your health care providers may also give you more specific instructions. HbA1c ( hemoglobin A1c) test This test provides information about blood sugar (glucose) control over the previous 2-3 months. It is used to check whether your diabetes management plan needs to be adjusted.  If you are meeting your treatment goals, this test is done at least 2 times a year.  If you are not meeting treatment goals or if your treatment goals have changed, this test is done 4 times a year. Blood pressure test  This test is done at every routine medical visit. For most people, the goal is less than 130/80. Ask your health care provider what your goal blood pressure should be. Dental  and eye exams  Visit your dentist two times a year.  If you have type 1 diabetes, get an eye exam 3-5 years after you are diagnosed, and then once a year after your first exam.  If you were diagnosed with type 1 diabetes as a child, get an eye exam when you are age 25 or older and have had diabetes for 3-5 years. After the first exam, you should get an eye exam once a year.  If you have type 2 diabetes, have an eye exam as soon as you are diagnosed, and then once a year after your first exam. Foot care exam  Visual foot exams are done at every routine medical visit. The exams check for cuts, bruises, redness, blisters, sores, or other problems with the feet.  A complete foot exam is done by your health care provider once a year. This exam includes an inspection of the structure and skin of your feet, and a check of the pulses and sensation in your feet.  Type 1 diabetes: Get your first exam 3-5 years after diagnosis.  Type 2 diabetes: Get your first exam as soon as you are diagnosed.  Check your feet every day for cuts, bruises, redness, blisters, or sores. If you have any of these or other problems that are not healing, contact your health care provider. Kidney function test ( urine microalbumin)  This test is done once a year.  Type 1 diabetes: Get your first test 5 years after diagnosis.  Type 2 diabetes:  Get your first test as soon as you are diagnosed.  If you have chronic kidney disease (CKD), get a serum creatinine and estimated glomerular filtration rate (eGFR) test once a year. Lipid profile (cholesterol, HDL, LDL, triglycerides)  This test should be done when you are diagnosed with diabetes, and every 5 years after the first test. If you are on medicines to lower your cholesterol, you may need to get this test done every year.  The goal for LDL is less than 100 mg/dL (5.5 mmol/L). If you are at high risk, the goal is less than 70 mg/dL (3.9 mmol/L).    The goal for  triglycerides is less than 150 mg/dL (8.3 mmol/L). Immunizations  The yearly flu (influenza) vaccine is recommended for everyone 6 months or older who has diabetes.  The pneumonia (pneumococcal) vaccine is recommended for everyone 2 years or older who has diabetes. If you are 55 or older, you may get the pneumonia vaccine as a series of two separate shots.  The hepatitis B vaccine is recommended for adults shortly after they have been diagnosed with diabetes.     Mental and emotional health  Screening for symptoms of eating disorders, anxiety, and depression is recommended at the time of diagnosis and afterward as needed. If your screening shows that you have symptoms (you have a positive screening result), you may need further evaluation and be referred to a mental health care provider. Diabetes self-management education  Education about how to manage your diabetes is recommended at diagnosis and ongoing as needed. Treatment plan  Your treatment plan will be reviewed at every medical visit. Summary  Managing diabetes (diabetes mellitus) can be complicated. Your diabetes treatment may be managed by a team of health care providers.  Your health care providers follow a schedule in order to help you get the best quality of care.  Standards of care including having regular physical exams, blood tests, blood pressure monitoring, immunizations, screening tests, and education about how to manage your diabetes.  Your health care providers may also give you more specific instructions based on your individual health. This information is not intended to replace advice given to you by your health care provider. Make sure you discuss any questions you have with your health care provider. Document Released: 11/16/2008 Document Revised: 10/18/2015 Document Reviewed: 10/18/2015 Elsevier Interactive Patient Education  2017 [employer].    IF you received an x-ray today, you will receive an  invoice from [facility]. Please contact [facility] at [phone] with questions or concerns regarding your invoice.   IF you received labwork today, you will receive an invoice from [facility]. Please contact [facility] at [phone] with questions or concerns regarding your invoice.   Our billing staff will not be able to assist you with questions regarding bills from these companies.  You will be contacted with the lab results as soon as they are available. The fastest way to get your results is to activate your My Chart account. Instructions are located on the last page of this paperwork. If you have not heard from [place] regarding the results in 2 weeks, please contact this office.

## 2016-06-05 NOTE — Progress Notes (Signed)
Subjective:  By signing my name below, I, Robin Delacruz, attest that this documentation has been prepared under the direction and in the presence of Wendie Agreste, MD Electronically Signed: Ladene Artist, ED Scribe 06/05/2016 at 8:41 AM.   Patient ID: Robin Delacruz, female    DOB: 25-Dec-1965, 51 y.o.   MRN: [id number]  Chief Complaint  Patient presents with  . Annual Exam   HPI Robin Delacruz is a 51 y.o. female who presents to Primary Care at [facility] for an annual exam. H/o DM, anemia, reactive airway, HTN, hyperlipidemia.   HTN Lab Results  Component Value Date   CREATININE 0.88 05/14/2015  Takes Norvasc 5 mg qd and Lotensin 40 mg qd. She checks her BP outside of the office with a few slightly elevated readings but average readings in the 120-130/70-80s. Pt denies any new side effects. She has taken Lotensin today but states that she recently ran out of Norvasc.   Hyperlipidemia Lab Results  Component Value Date   CHOL 131 05/14/2015   HDL 43 (L) 05/14/2015   LDLCALC 65 05/14/2015   TRIG 117 05/14/2015   CHOLHDL 3.0 05/14/2015  On Lipitor 5 mg qd. She denies any side effects. Pt is fasting at this visit.   Lab Results  Component Value Date   ALT 11 05/14/2015   AST 14 05/14/2015   ALKPHOS 66 05/14/2015   BILITOT 0.3 05/14/2015   DM Lab Results  Component Value Date   HGBA1C 6.8 05/14/2015   Lab Results  Component Value Date   MICROALBUR 1.1 08/04/2014  Takes Metformin 5 mg qd. Pt checks her blood glucose outside of the office with readings of 130-150s in the mornings fasting and 75-200 during the day. She states that she has cut out a lot of carbohydrates as well.   CA Screening  Colonoscopy: Referred in 2016. Pt has not had a colonoscopy done yet.  Breast CA: Pt's last mammogram was 04/22/16; negative.  Cervical CA screening: pap smear on 04/29/16; normal  HIV Screening: has not had.  Pt was having vaginal bleeding and diaphoretic spells that lasts  for 5 seconds accompanied with blurred vision which was followed by her OBGYN Robin Delacruz. She had a normal pap smear on 3/28, an ablation and was prescribed hormones which she states all worsened her symptoms. Pt had a sonogram done on 4/12 that showed fibroids. Her OBGYN is considering a hysteromyotomy. Pt also reports a family h/o thyroid disease.   Immunizations  Immunization History  Administered Date(s) Administered  . Influenza Split 10/29/2011  . Influenza,inj,Quad PF,36+ Mos 12/13/2012  . Influenza-Unspecified 11/03/2015  . Pneumococcal Polysaccharide-23 12/15/2012  Tetanus: unknown   Depression Screening  Depression screen [facility] 2/9 06/05/2016 03/06/2016 05/14/2015 08/04/2014  Decreased Interest 0 0 0 0  Down, Depressed, Hopeless 0 0 0 0  PHQ - 2 Score 0 0 0 0     Visual Acuity Screening   Right eye Left eye Both eyes  Without correction: 20/15 -1 20/20 20/13 -1  With correction:     Vision: Followed with eye doctor on 04/01/16 and increased her prescription. She reported blurred vision with diaphoretic spells but no glaucoma or any other eye diseases were diagnosed.  Dentist: Pt reports teeth that are bleeding which her dentist suspects may be related to DM. She is followed by dentist every 6 months.  Exercise: She is exercising 3 days/week.  Pt also requests a refill of inhaler for possible wheeze. 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 She required an inhaler a few years ago for illness.   Patient Active Problem List   Diagnosis Date Noted  . Type 2 diabetes mellitus without complication (HCC) 03/05/2014  . Ovarian cyst   . Anemia   . Dyspnea   . Fatigue   . Chest pain 11/02/2011  . Hypertension    Past Medical History:  Diagnosis Date  . Allergy   . Anemia   . Chest pain   . Diabetes mellitus    Diagnosed at age 29  . Dyspnea   . Fatigue   . Hypertension   . Ovarian cyst    Past Surgical History:  Procedure Laterality Date  .  ABLATION    . CESAREAN SECTION    . TONSILLECTOMY    . TUBAL LIGATION     Allergies  Allergen Reactions  . Penicillins Anaphylaxis and Hives  . Sulfa Antibiotics Anaphylaxis and Hives   Prior to Admission medications   Medication Sig Start Date End Date Taking? Authorizing Provider  albuterol (PROVENTIL HFA;VENTOLIN HFA) 108 (90 BASE) MCG/ACT inhaler Inhale 2 puffs into the lungs every 4 (four) hours as needed for wheezing or shortness of breath (cough, shortness of breath or wheezing.). 03/05/14   David H Hopper, MD  amLODipine (NORVASC) 5 MG tablet TAKE 1 TABLET BY MOUTH EVERY DAY 05/29/16    R , MD  aspirin EC 81 MG tablet Take 1 tablet (81 mg total) by mouth daily. 11/02/11   Brian S Crenshaw, MD  atorvastatin (LIPITOR) 20 MG tablet Take 1 tablet (20 mg total) by mouth daily. 05/14/15   David H Hopper, MD  benazepril (LOTENSIN) 40 MG tablet TAKE 1 TABLET BY MOUTH EVERY DAY 05/14/15   David H Hopper, MD  benzonatate (TESSALON) 100 MG capsule Take 1-2 capsules (100-200 mg total) by mouth 3 (three) times daily as needed for cough. 03/06/16   Stephanie D English, PA  ferrous sulfate 325 (65 FE) MG tablet Take 1 tablet (325 mg total) by mouth daily with breakfast. 10/29/11    R , MD  Guaifenesin (MUCINEX MAXIMUM STRENGTH) 1200 MG TB12 Take 1 tablet (1,200 mg total) by mouth every 12 (twelve) hours as needed. 03/06/16   Stephanie D English, PA  metFORMIN (GLUCOPHAGE) 500 MG tablet Take 1 tablet (500 mg total) by mouth daily with breakfast. 05/14/15   David H Hopper, MD  oseltamivir (TAMIFLU) 75 MG capsule Take 1 capsule (75 mg total) by mouth 2 (two) times daily. 03/06/16   Stephanie D English, PA   Social History   Social History  . Marital status: Divorced    Spouse name: N/A  . Number of children: 2  . Years of education: N/A   Occupational History  . [employer]   Social History Main Topics  . Smoking status: Never Smoker  . Smokeless tobacco: Never  Used  . Alcohol use No  . Drug use: No  . Sexual activity: No   Other Topics Concern  . Not on file   Social History Narrative  . No narrative on file   Review of Systems  Constitutional: Positive for diaphoresis.  HENT: Positive for dental problem and postnasal drip.   Genitourinary: Positive for menstrual problem and vaginal bleeding.      Objective:   Physical Exam  Constitutional: She is oriented to person, place, and time. She appears well-developed and well-nourished.  HENT:  Head: Normocephalic and atraumatic.  Right   Ear: External ear normal.  Left Ear: External ear normal.  Mouth/Throat: Oropharynx is clear and moist.  Eyes: Conjunctivae are normal. Pupils are equal, round, and reactive to light.  Neck: Normal range of motion. Neck supple. No thyromegaly present.  Cardiovascular: Normal rate, regular rhythm, normal heart sounds and intact distal pulses.   No murmur heard. Pulmonary/Chest: Effort normal and breath sounds normal. No respiratory distress. She has no wheezes.  Abdominal: Soft. Bowel sounds are normal. There is no tenderness.  Musculoskeletal: Normal range of motion. She exhibits no edema or tenderness.  Some breakdown of transverse arch, L greater than R. Some pes planus. Normal tip toe stance with heal inversion. No significant callus. No skin break down. Sensation intact distally.  Lymphadenopathy:    She has no cervical adenopathy.  Neurological: She is alert and oriented to person, place, and time.  Skin: Skin is warm and dry. No rash noted.  Psychiatric: She has a normal mood and affect. Her behavior is normal. Thought content normal.    Vitals:   06/05/16 0822 06/05/16 0834  BP: (!) 161/95 140/90  Pulse: 76   Resp: 16   Temp: 98.4 F (36.9 C)   TempSrc: Oral   SpO2: 99%   Weight: 199 lb (90.3 kg)   Height: 5' 5.5" (1.664 m)       Assessment & Plan:    Robin Delacruz is a 51 y.o. female Annual physical exam  --anticipatory  guidance as below in AVS, screening labs above. Health maintenance items as above in HPI discussed/recommended as applicable.   Flushing - Plan: TSH Night sweats - Plan: CBC, TSH  - May be hormone related. Check CBC, TSH, continue follow-up with OB/GYN. RTC precautions if worsening  Type 2 diabetes mellitus without complication, without long-term current use of insulin (HCC) - Plan: Hemoglobin A1c, Microalbumin, urine, metFORMIN (GLUCOPHAGE) 500 MG tablet, DISCONTINUED: benazepril (LOTENSIN) 40 MG tablet  -Continue metformin same dose, check A1c, urine microalbumin.  Screen for colon cancer - Plan: Ambulatory referral to Gastroenterology  Hyperlipidemia, unspecified hyperlipidemia type - Plan: Lipid panel, Comprehensive metabolic panel, atorvastatin (LIPITOR) 20 MG tablet  -Tolerating Lipitor, continue same dose, labs pending.  Essential hypertension - Plan: Comprehensive metabolic panel, amLODipine (NORVASC) 5 MG tablet, DISCONTINUED: benazepril (LOTENSIN) 40 MG tablet  -Borderline, but we'll continue same dose meds for now. If remains over 140/90, advised to increase Norvasc to 10 mg daily, and call if dose change needed.  Need for Tdap vaccination - Plan: Tdap vaccine greater than or equal to 7yo IM  Wheeze - Plan: albuterol (PROVENTIL HFA;VENTOLIN HFA) 108 (90 Base) MCG/ACT inhaler  -Albuterol refilled if needed. RTC precautions.  Meds ordered this encounter  Medications  . medroxyPROGESTERone (PROVERA) 10 MG tablet  . metFORMIN (GLUCOPHAGE) 500 MG tablet    Sig: Take 1 tablet (500 mg total) by mouth daily with breakfast.    Dispense:  90 tablet    Refill:  1  . DISCONTD: benazepril (LOTENSIN) 40 MG tablet    Sig: TAKE 1 TABLET BY MOUTH EVERY DAY    Dispense:  90 tablet    Refill:  1  . atorvastatin (LIPITOR) 20 MG tablet    Sig: Take 1 tablet (20 mg total) by mouth daily.    Dispense:  90 tablet    Refill:  1  . amLODipine (NORVASC) 5 MG tablet    Sig: Take 1 tablet (5 mg  total) by mouth daily.    Dispense:  90 tablet  Refill:  1  . albuterol (PROVENTIL HFA;VENTOLIN HFA) 108 (90 Base) MCG/ACT inhaler    Sig: Inhale 1-2 puffs into the lungs every 4 (four) hours as needed for wheezing or shortness of breath (cough, shortness of breath or wheezing.).    Dispense:  1 Inhaler    Refill:  0   Patient Instructions    No change in medications for now, but if your blood pressure remains over 130/80, increase amlodipine to 10 mg each day. If you have swelling in your ankles with that dose, let me know. If you do end up increasing to 2 pills per day, let me know so I can send in the new dose of medication.  I will check thyroid test, blood counts for your night sweats and flushing symptoms, however if those worsen or increase in frequency, return to discuss that further. Continue follow-up with OB/GYN to discuss hormonal cause of the symptoms.  Recheck in 6 months unless A1c is elevated, then would need to see you in 3 months. Let me know if you have questions in the meantime.  Diabetes Mellitus and Standards of Medical Care Managing diabetes (diabetes mellitus) can be complicated. Your diabetes treatment may be managed by a team of health care providers, including:  A diet and nutrition specialist (registered dietitian).  A nurse.  A certified diabetes educator (CDE).  A diabetes specialist (endocrinologist).  An eye doctor.  A primary care provider.  A dentist. Your health care providers follow a schedule in order to help you get the best quality of care. The following schedule is a general guideline for your diabetes management plan. Your health care providers may also give you more specific instructions. HbA1c ( hemoglobin A1c) test This test provides information about blood sugar (glucose) control over the previous 2-3 months. It is used to check whether your diabetes management plan needs to be adjusted.  If you are meeting your treatment goals,  this test is done at least 2 times a year.  If you are not meeting treatment goals or if your treatment goals have changed, this test is done 4 times a year. Blood pressure test  This test is done at every routine medical visit. For most people, the goal is less than 130/80. Ask your health care provider what your goal blood pressure should be. Dental and eye exams  Visit your dentist two times a year.  If you have type 1 diabetes, get an eye exam 3-5 years after you are diagnosed, and then once a year after your first exam.  If you were diagnosed with type 1 diabetes as a child, get an eye exam when you are age 10 or older and have had diabetes for 3-5 years. After the first exam, you should get an eye exam once a year.  If you have type 2 diabetes, have an eye exam as soon as you are diagnosed, and then once a year after your first exam. Foot care exam  Visual foot exams are done at every routine medical visit. The exams check for cuts, bruises, redness, blisters, sores, or other problems with the feet.  A complete foot exam is done by your health care provider once a year. This exam includes an inspection of the structure and skin of your feet, and a check of the pulses and sensation in your feet.  Type 1 diabetes: Get your first exam 3-5 years after diagnosis.  Type 2 diabetes: Get your first exam as soon as you are   diagnosed.  Check your feet every day for cuts, bruises, redness, blisters, or sores. If you have any of these or other problems that are not healing, contact your health care provider. Kidney function test ( urine microalbumin)  This test is done once a year.  Type 1 diabetes: Get your first test 5 years after diagnosis.  Type 2 diabetes: Get your first test as soon as you are diagnosed.  If you have chronic kidney disease (CKD), get a serum creatinine and estimated glomerular filtration rate (eGFR) test once a year. Lipid profile (cholesterol, HDL, LDL,  triglycerides)  This test should be done when you are diagnosed with diabetes, and every 5 years after the first test. If you are on medicines to lower your cholesterol, you may need to get this test done every year.  The goal for LDL is less than 100 mg/dL (5.5 mmol/L). If you are at high risk, the goal is less than 70 mg/dL (3.9 mmol/L).    The goal for triglycerides is less than 150 mg/dL (8.3 mmol/L). Immunizations  The yearly flu (influenza) vaccine is recommended for everyone 6 months or older who has diabetes.  The pneumonia (pneumococcal) vaccine is recommended for everyone 2 years or older who has diabetes. If you are 65 or older, you may get the pneumonia vaccine as a series of two separate shots.  The hepatitis B vaccine is recommended for adults shortly after they have been diagnosed with diabetes.     Mental and emotional health  Screening for symptoms of eating disorders, anxiety, and depression is recommended at the time of diagnosis and afterward as needed. If your screening shows that you have symptoms (you have a positive screening result), you may need further evaluation and be referred to a mental health care provider. Diabetes self-management education  Education about how to manage your diabetes is recommended at diagnosis and ongoing as needed. Treatment plan  Your treatment plan will be reviewed at every medical visit. Summary  Managing diabetes (diabetes mellitus) can be complicated. Your diabetes treatment may be managed by a team of health care providers.  Your health care providers follow a schedule in order to help you get the best quality of care.  Standards of care including having regular physical exams, blood tests, blood pressure monitoring, immunizations, screening tests, and education about how to manage your diabetes.  Your health care providers may also give you more specific instructions based on your individual health. This information is  not intended to replace advice given to you by your health care provider. Make sure you discuss any questions you have with your health care provider. Document Released: 11/16/2008 Document Revised: 10/18/2015 Document Reviewed: 10/18/2015 Elsevier Interactive Patient Education  2017 Elsevier Inc.    IF you received an x-ray today, you will receive an invoice from [facility]. Please contact [facility] at [phone] with questions or concerns regarding your invoice.   IF you received labwork today, you will receive an invoice from [facility]. Please contact [facility] at [phone] with questions or concerns regarding your invoice.   Our billing staff will not be able to assist you with questions regarding bills from these companies.  You will be contacted with the lab results as soon as they are available. The fastest way to get your results is to activate your My Chart account. Instructions are located on the last page of this paperwork. If you have not heard from us regarding the results in 2 weeks, please contact this   office.      I personally performed the services described in this documentation, which was scribed in my presence. The recorded information has been reviewed and considered for accuracy and completeness, addended by me as needed, and agree with information above.  Signed,   Merri Ray, MD [facility].  06/07/16 11:19 AM

## 2016-06-06 ENCOUNTER — Other Ambulatory Visit: Payer: Self-pay | Admitting: Family Medicine

## 2016-06-06 DIAGNOSIS — I10 Essential (primary) hypertension: Secondary | ICD-10-CM

## 2016-06-06 DIAGNOSIS — E119 Type 2 diabetes mellitus without complications: Secondary | ICD-10-CM

## 2016-06-06 LAB — COMPREHENSIVE METABOLIC PANEL
ALT: 14 IU/L (ref 0–32)
AST: 18 IU/L (ref 0–40)
Albumin/Globulin Ratio: 1.6 (ref 1.2–2.2)
Albumin: 4.2 g/dL (ref 3.5–5.5)
Alkaline Phosphatase: 69 IU/L (ref 39–117)
BUN/Creatinine Ratio: 23 (ref 9–23)
BUN: 19 mg/dL (ref 6–24)
Bilirubin Total: 0.3 mg/dL (ref 0.0–1.2)
CO2: 25 mmol/L (ref 18–29)
Calcium: 9.3 mg/dL (ref 8.7–10.2)
Chloride: 103 mmol/L (ref 96–106)
Creatinine, Ser: 0.84 mg/dL (ref 0.57–1.00)
GFR calc Af Amer: 92 mL/min/{1.73_m2} (ref >59)
GFR calc non Af Amer: 80 mL/min/{1.73_m2} (ref >59)
Globulin, Total: 2.7 g/dL (ref 1.5–4.5)
Glucose: 133 mg/dL — ABNORMAL HIGH (ref 65–99)
Potassium: 4.4 mmol/L (ref 3.5–5.2)
Sodium: 144 mmol/L (ref 134–144)
Total Protein: 6.9 g/dL (ref 6.0–8.5)

## 2016-06-06 LAB — HEMOGLOBIN A1C
Est. average glucose Bld gHb Est-mCnc: 160 mg/dL
Hgb A1c MFr Bld: 7.2 % — ABNORMAL HIGH (ref 4.8–5.6)

## 2016-06-06 LAB — CBC
Hematocrit: 40.9 % (ref 34.0–46.6)
Hemoglobin: 13.3 g/dL (ref 11.1–15.9)
MCH: 26.4 pg — ABNORMAL LOW (ref 26.6–33.0)
MCHC: 32.5 g/dL (ref 31.5–35.7)
MCV: 81 fL (ref 79–97)
Platelets: 253 10*3/uL (ref 150–379)
RBC: 5.04 x10E6/uL (ref 3.77–5.28)
RDW: 13.9 % (ref 12.3–15.4)
WBC: 5.6 10*3/uL (ref 3.4–10.8)

## 2016-06-06 LAB — LIPID PANEL
Chol/HDL Ratio: 2.9 ratio (ref 0.0–4.4)
Cholesterol, Total: 113 mg/dL (ref 100–199)
HDL: 39 mg/dL — ABNORMAL LOW (ref >39)
LDL Calculated: 52 mg/dL (ref 0–99)
Triglycerides: 108 mg/dL (ref 0–149)
VLDL Cholesterol Cal: 22 mg/dL (ref 5–40)

## 2016-06-06 LAB — TSH: TSH: 1.4 u[IU]/mL (ref 0.450–4.500)

## 2016-06-06 LAB — MICROALBUMIN, URINE: Microalbumin, Urine: 10.8 ug/mL

## 2016-06-07 ENCOUNTER — Other Ambulatory Visit: Payer: Self-pay | Admitting: Family Medicine

## 2016-06-07 DIAGNOSIS — E785 Hyperlipidemia, unspecified: Secondary | ICD-10-CM

## 2016-06-07 DIAGNOSIS — E119 Type 2 diabetes mellitus without complications: Secondary | ICD-10-CM

## 2016-06-25 ENCOUNTER — Encounter: Payer: 59 | Admitting: Family Medicine

## 2016-07-27 ENCOUNTER — Encounter: Payer: Self-pay | Admitting: Gastroenterology

## 2016-07-27 ENCOUNTER — Ambulatory Visit (AMBULATORY_SURGERY_CENTER): Payer: Self-pay | Admitting: *Deleted

## 2016-07-27 VITALS — Ht 67.0 in | Wt 203.0 lb

## 2016-07-27 DIAGNOSIS — Z1211 Encounter for screening for malignant neoplasm of colon: Secondary | ICD-10-CM

## 2016-07-27 MED ORDER — NA SULFATE-K SULFATE-MG SULF 17.5-3.13-1.6 GM/177ML PO SOLN: ORAL | 0 refills | Status: DC

## 2016-07-27 NOTE — Progress Notes (Signed)
Pt denies allergies to eggs or soy products. Denies difficulty with sedation or anesthesia. Denies any diet or weight loss medications. Denies use of supplemental oxygen.  Emmi instructions given for procedure.  

## 2016-08-06 DIAGNOSIS — N946 Dysmenorrhea, unspecified: Secondary | ICD-10-CM | POA: Diagnosis not present

## 2016-08-07 ENCOUNTER — Other Ambulatory Visit: Payer: Self-pay | Admitting: Emergency Medicine

## 2016-08-07 DIAGNOSIS — E119 Type 2 diabetes mellitus without complications: Secondary | ICD-10-CM

## 2016-08-07 MED ORDER — METFORMIN HCL 500 MG PO TABS: 500.0000 mg | ORAL_TABLET | Freq: Every day | ORAL | 3 refills | Status: DC

## 2016-08-10 ENCOUNTER — Ambulatory Visit (AMBULATORY_SURGERY_CENTER): Payer: 59 | Admitting: Gastroenterology

## 2016-08-10 ENCOUNTER — Encounter: Payer: Self-pay | Admitting: Gastroenterology

## 2016-08-10 VITALS — BP 126/80 | HR 58 | Temp 98.6°F | Resp 10 | Ht 65.5 in | Wt 199.0 lb

## 2016-08-10 DIAGNOSIS — Z1211 Encounter for screening for malignant neoplasm of colon: Secondary | ICD-10-CM

## 2016-08-10 DIAGNOSIS — Z1212 Encounter for screening for malignant neoplasm of rectum: Secondary | ICD-10-CM

## 2016-08-10 MED ORDER — SODIUM CHLORIDE 0.9 % IV SOLN: 500.0000 mL | INTRAVENOUS | Status: DC

## 2016-08-10 NOTE — Patient Instructions (Signed)
Hemorrhoids seen today, handout given. Repeat colonoscopy in 10 years. Resume current medications. Call us with any questions or concerns. Thank you!!  YOU HAD AN ENDOSCOPIC PROCEDURE TODAY AT THE [facility]:   Refer to the procedure report that was given to you for any specific questions about what was found during the examination.  If the procedure report does not answer your questions, please call your gastroenterologist to clarify.  If you requested that your care partner not be given the details of your procedure findings, then the procedure report has been included in a sealed envelope for you to review at your convenience later.  YOU SHOULD EXPECT: Some feelings of bloating in the abdomen. Passage of more gas than usual.  Walking can help get rid of the air that was put into your GI tract during the procedure and reduce the bloating. If you had a lower endoscopy (such as a colonoscopy or flexible sigmoidoscopy) you may notice spotting of blood in your stool or on the toilet paper. If you underwent a bowel prep for your procedure, you may not have a normal bowel movement for a few days.  Please Note:  You might notice some irritation and congestion in your nose or some drainage.  This is from the oxygen used during your procedure.  There is no need for concern and it should clear up in a day or so.  SYMPTOMS TO REPORT IMMEDIATELY:   Following lower endoscopy (colonoscopy or flexible sigmoidoscopy):  Excessive amounts of blood in the stool  Significant tenderness or worsening of abdominal pains  Swelling of the abdomen that is new, acute  Fever of 100F or higher  For urgent or emergent issues, a gastroenterologist can be reached at any hour by calling [phone].   DIET:  We do recommend a small meal at first, but then you may proceed to your regular diet.  Drink plenty of fluids but you should avoid alcoholic beverages for 24 hours.  ACTIVITY:  You should plan to take  it easy for the rest of today and you should NOT DRIVE or use heavy machinery until tomorrow (because of the sedation medicines used during the test).    FOLLOW UP: Our staff will call the number listed on your records the next business day following your procedure to check on you and address any questions or concerns that you may have regarding the information given to you following your procedure. If we do not reach you, we will leave a message.  However, if you are feeling well and you are not experiencing any problems, there is no need to return our call.  We will assume that you have returned to your regular daily activities without incident.  If any biopsies were taken you will be contacted by phone or by letter within the next 1-3 weeks.  Please call us at [phone] if you have not heard about the biopsies in 3 weeks.    SIGNATURES/CONFIDENTIALITY: You and/or your care partner have signed paperwork which will be entered into your electronic medical record.  These signatures attest to the fact that that the information above on your After Visit Summary has been reviewed and is understood.  Full responsibility of the confidentiality of this discharge information lies with you and/or your care-partner.

## 2016-08-10 NOTE — Progress Notes (Signed)
To recovery, report to Myers, RN, VSS. 

## 2016-08-10 NOTE — Progress Notes (Signed)
Pt's states no medical or surgical changes since previsit or office visit. 

## 2016-08-10 NOTE — Op Note (Signed)
[facility] Patient Name: Robin Delacruz Procedure Date: 08/10/2016 9:03 AM MRN: [id number] Endoscopist: Napoleon FormKavitha V. Nandigam , MD Age: [age] Referring MD:  Date of Birth: January 12, 1966 Gender: Female Account #: [id number] Procedure:                Colonoscopy Indications:              Screening for colorectal malignant neoplasm, This                            is the patient's first colonoscopy Medicines:                Monitored Anesthesia Care Procedure:                Pre-Anesthesia Assessment:                           - Prior to the procedure, a History and Physical                            was performed, and patient medications and                            allergies were reviewed. The patient's tolerance of                            previous anesthesia was also reviewed. The risks                            and benefits of the procedure and the sedation                            options and risks were discussed with the patient.                            All questions were answered, and informed consent                            was obtained. Prior Anticoagulants: The patient has                            taken no previous anticoagulant or antiplatelet                            agents. ASA Grade Assessment: II - A patient with                            mild systemic disease. After reviewing the risks                            and benefits, the patient was deemed in                            satisfactory condition to undergo the procedure.  After obtaining informed consent, the colonoscope                            was passed under direct vision. Throughout the                            procedure, the patient's blood pressure, pulse, and                            oxygen saturations were monitored continuously. The                            Colonoscope was introduced through the anus and                            advanced to the  the cecum, identified by                            appendiceal orifice and ileocecal valve. The                            colonoscopy was performed without difficulty. The                            patient tolerated the procedure well. The quality                            of the bowel preparation was adequate. The                            ileocecal valve, appendiceal orifice, and rectum                            were photographed. Scope In: 9:18:33 AM Scope Out: 9:33:13 AM Scope Withdrawal Time: 0 hours 8 minutes 6 seconds  Total Procedure Duration: 0 hours 14 minutes 40 seconds  Findings:                 The perianal and digital rectal examinations were                            normal.                           Non-bleeding internal hemorrhoids were found during                            retroflexion. The hemorrhoids were small.                           The exam was otherwise without abnormality. Complications:            No immediate complications. Estimated Blood Loss:     Estimated blood loss: none. Impression:               - Non-bleeding internal hemorrhoids.                           -  The examination was otherwise normal.                           - No specimens collected. Recommendation:           - Patient has a contact number available for                            emergencies. The signs and symptoms of potential                            delayed complications were discussed with the                            patient. Return to normal activities tomorrow.                            Written discharge instructions were provided to the                            patient.                           - Resume previous diet.                           - Continue present medications.                           - Repeat colonoscopy in 10 years for screening                            purposes. Napoleon Form, MD 08/10/2016 9:37:29 AM This report has been signed  electronically.

## 2016-08-11 ENCOUNTER — Telehealth: Payer: Self-pay | Admitting: *Deleted

## 2016-08-11 NOTE — Telephone Encounter (Signed)
  Follow up Call-  Call back number 08/10/2016  Post procedure Call Back phone  # [phone]  Permission to leave phone message Yes  Some recent data might be hidden     Patient questions:  VM left. Second call.

## 2016-08-11 NOTE — Telephone Encounter (Signed)
  Follow up Call-  Call back number 08/10/2016  Post procedure Call Back phone  # [phone]  Permission to leave phone message Yes  Some recent data might be hidden     Patient questions:  Message left to call us if necessary.

## 2016-08-19 DIAGNOSIS — N924 Excessive bleeding in the premenopausal period: Secondary | ICD-10-CM | POA: Diagnosis not present

## 2016-09-25 DIAGNOSIS — Z23 Encounter for immunization: Secondary | ICD-10-CM | POA: Diagnosis not present

## 2016-10-14 DIAGNOSIS — N920 Excessive and frequent menstruation with regular cycle: Secondary | ICD-10-CM | POA: Diagnosis not present

## 2016-10-14 DIAGNOSIS — N944 Primary dysmenorrhea: Secondary | ICD-10-CM | POA: Diagnosis not present

## 2016-10-15 ENCOUNTER — Encounter (HOSPITAL_COMMUNITY): Admission: RE | Admit: 2016-10-15 | Payer: 59 | Source: Ambulatory Visit

## 2016-10-20 ENCOUNTER — Ambulatory Visit (HOSPITAL_BASED_OUTPATIENT_CLINIC_OR_DEPARTMENT_OTHER): Admit: 2016-10-20 | Payer: 59 | Admitting: Obstetrics & Gynecology

## 2016-10-20 ENCOUNTER — Encounter (HOSPITAL_BASED_OUTPATIENT_CLINIC_OR_DEPARTMENT_OTHER): Payer: Self-pay

## 2016-10-20 SURGERY — HYSTERECTOMY, VAGINAL, LAPAROSCOPY-ASSISTED
Anesthesia: General

## 2016-10-27 DIAGNOSIS — N920 Excessive and frequent menstruation with regular cycle: Secondary | ICD-10-CM | POA: Diagnosis not present

## 2016-10-27 DIAGNOSIS — N736 Female pelvic peritoneal adhesions (postinfective): Secondary | ICD-10-CM | POA: Diagnosis not present

## 2016-10-27 DIAGNOSIS — Z411 Encounter for cosmetic surgery: Secondary | ICD-10-CM | POA: Diagnosis not present

## 2016-10-27 DIAGNOSIS — N944 Primary dysmenorrhea: Secondary | ICD-10-CM | POA: Diagnosis not present

## 2016-10-27 DIAGNOSIS — N946 Dysmenorrhea, unspecified: Secondary | ICD-10-CM | POA: Diagnosis not present

## 2016-12-29 ENCOUNTER — Other Ambulatory Visit: Payer: Self-pay | Admitting: Family Medicine

## 2016-12-29 DIAGNOSIS — I10 Essential (primary) hypertension: Secondary | ICD-10-CM

## 2017-01-07 ENCOUNTER — Ambulatory Visit: Payer: 59 | Admitting: Family Medicine

## 2017-01-15 ENCOUNTER — Ambulatory Visit: Payer: 59 | Admitting: Family Medicine

## 2017-02-26 ENCOUNTER — Encounter: Payer: Self-pay | Admitting: Family Medicine

## 2017-02-26 ENCOUNTER — Ambulatory Visit: Payer: 59 | Admitting: Family Medicine

## 2017-02-26 VITALS — BP 140/80 | HR 62 | Temp 98.0°F | Resp 16 | Ht 65.5 in | Wt 200.0 lb

## 2017-02-26 DIAGNOSIS — E785 Hyperlipidemia, unspecified: Secondary | ICD-10-CM

## 2017-02-26 DIAGNOSIS — I10 Essential (primary) hypertension: Secondary | ICD-10-CM | POA: Diagnosis not present

## 2017-02-26 DIAGNOSIS — E119 Type 2 diabetes mellitus without complications: Secondary | ICD-10-CM | POA: Diagnosis not present

## 2017-02-26 NOTE — Patient Instructions (Addendum)
  Thank you for coming in today. 3 month follow up for diabetes.    IF you received an x-ray today, you will receive an invoice from [facility]. Please contact [facility] at [phone] with questions or concerns regarding your invoice.   IF you received labwork today, you will receive an invoice from [facility]. Please contact [facility] at [phone] with questions or concerns regarding your invoice.   Our billing staff will not be able to assist you with questions regarding bills from these companies.  You will be contacted with the lab results as soon as they are available. The fastest way to get your results is to activate your My Chart account. Instructions are located on the last page of this paperwork. If you have not heard from us regarding the results in 2 weeks, please contact this office.

## 2017-02-26 NOTE — Progress Notes (Signed)
Subjective:    Patient ID: Robin Delacruz, female    DOB: 05/04/1965, 52 y.o.   MRN: [id number]  HPI Robin Delacruz is a 52 y.o. female Presents today for: Chief Complaint  Patient presents with  . Diabetes    Follow up  . Medication Refill    Benazepril, Atorvastatin. Insurance only pays for 30 day supply per pt  Here for follow-up, last office visit in May 2018.  Hysterectomy last year with abdominoplasty in September. Doing well since then.   Diabetes Lab Results  Component Value Date   HGBA1C 7.2 (H) 06/05/2016  She is taking metformin 500 mg daily. Microalbumin 10.8  06/05/2016 Ophtho-exam 04/01/16, and then again this am.  Pneumovax 2014 On ACE inhibitor, statin, aspirin Fasting readings around 115, post prandial 175.   Hypertension Lab Results  Component Value Date   CREATININE 0.84 06/05/2016  Takes Lotensin 40 mg daily, amlodipine 5 mg daily, aspirin 81 mg daily.  Did have to stop BP meds after surgery last year as lower BP off meds. Restarted both meds in November.   Hyperlipidemia Lab Results  Component Value Date   CHOL 113 06/05/2016   HDL 39 (L) 06/05/2016   LDLCALC 52 06/05/2016   TRIG 108 06/05/2016   CHOLHDL 2.9 06/05/2016   Lab Results  Component Value Date   ALT 14 06/05/2016   AST 18 06/05/2016   ALKPHOS 69 06/05/2016   BILITOT 0.3 06/05/2016  Takes Lipitor 20 mg daily.   Patient Active Problem List   Diagnosis Date Noted  . Type 2 diabetes mellitus without complication (HCC) 03/05/2014  . Ovarian cyst   . Anemia   . Dyspnea   . Fatigue   . Chest pain 11/02/2011  . Hypertension    Past Medical History:  Diagnosis Date  . Allergy    seasonal  . Anemia   . Chest pain   . Diabetes mellitus    Diagnosed at age 84  . Dyspnea   . Fatigue   . Hypertension   . Ovarian cyst    Past Surgical History:  Procedure Laterality Date  . ABLATION    . CESAREAN SECTION    . TONSILLECTOMY    . TUBAL LIGATION     Allergies    Allergen Reactions  . Penicillins Anaphylaxis and Hives  . Sulfa Antibiotics Anaphylaxis and Hives   Prior to Admission medications   Medication Sig Start Date End Date Taking? Authorizing Provider  albuterol (PROVENTIL HFA;VENTOLIN HFA) 108 (90 Base) MCG/ACT inhaler Inhale 1-2 puffs into the lungs every 4 (four) hours as needed for wheezing or shortness of breath (cough, shortness of breath or wheezing.). 06/05/16  Yes [name], MD  amLODipine (NORVASC) 5 MG tablet TAKE 1 TABLET BY MOUTH EVERY DAY 12/29/16  Yes [name], MD  aspirin EC 81 MG tablet Take 1 tablet (81 mg total) by mouth daily. 11/02/11  Yes [name], MD  atorvastatin (LIPITOR) 20 MG tablet TAKE 1 TABLET BY MOUTH EVERY DAY 06/08/16  Yes [name], MD  benazepril (LOTENSIN) 40 MG tablet TAKE 1 TABLET BY MOUTH EVERY DAY 06/06/16  Yes [name], MD  ferrous sulfate 325 (65 FE) MG tablet Take 1 tablet (325 mg total) by mouth daily with breakfast. 10/29/11  Yes [name], MD  metFORMIN (GLUCOPHAGE) 500 MG tablet Take 1 tablet (500 mg total) by mouth daily with breakfast. 08/07/16  Yes [name], MD   Social History  Socioeconomic History  . Marital status: Divorced    Spouse name: Not on file  . Number of children: 2  . Years of education: Not on file  . Highest education level: Not on file  Social Needs  . Financial resource strain: Not on file  . Food insecurity - worry: Not on file  . Food insecurity - inability: Not on file  . Transportation needs - medical: Not on file  . Transportation needs - non-medical: Not on file  Occupational History  . Occupation: MENTAL HEALTH    Employer: [employer]  Tobacco Use  . Smoking status: Never Smoker  . Smokeless tobacco: Never Used  Substance and Sexual Activity  . Alcohol use: No    Alcohol/week: 0.0 oz  . Drug use: No  . Sexual activity: No  Other Topics Concern  . Not on file  Social History Narrative  . Not on  file    Review of Systems  Constitutional: Negative for fatigue and unexpected weight change.  Respiratory: Negative for chest tightness and shortness of breath.   Cardiovascular: Negative for chest pain, palpitations and leg swelling.  Gastrointestinal: Negative for abdominal pain and blood in stool.  Neurological: Negative for dizziness, syncope, light-headedness and headaches.       Objective:   Physical Exam  Constitutional: She is oriented to person, place, and time. She appears well-developed and well-nourished.  HENT:  Head: Normocephalic and atraumatic.  Eyes: Conjunctivae and EOM are normal. Pupils are equal, round, and reactive to light.  Neck: Carotid bruit is not present.  Cardiovascular: Normal rate, regular rhythm, normal heart sounds and intact distal pulses.  Pulmonary/Chest: Effort normal and breath sounds normal.  Abdominal: Soft. She exhibits no pulsatile midline mass. There is no tenderness.  Neurological: She is alert and oriented to person, place, and time.  Skin: Skin is warm and dry.  Psychiatric: She has a normal mood and affect. Her behavior is normal.  Vitals reviewed.  Vitals:   02/26/17 1655  BP: 140/80  Pulse: 62  Resp: 16  Temp: 98 F (36.7 C)  SpO2: 98%  Weight: 200 lb (90.7 kg)  Height: 5' 5.5" (1.664 m)       Assessment & Plan:  Robin Delacruz is a 52 y.o. female Type 2 diabetes mellitus without complication, without long-term current use of insulin (HCC) - Plan: Hemoglobin A1c  - Suspect good control based on home readings. Check A1c, continue metformin same dose for now - 500 mg daily  -If well controlled, can have visit every 6 months for diabetes, otherwise recheck in 3 months.  Essential hypertension - Plan: Comprehensive metabolic panel  -Borderline, but overall controlled. Continue same doses of Lotensin and Norvasc. Check CMP  Hyperlipidemia, unspecified hyperlipidemia type - Plan: Lipid panel  -Tolerating Lipitor, to  continue same dose, lipid panel pending.  No orders of the defined types were placed in this encounter.  Patient Instructions    Thank you for coming in today. 3 month follow up for diabetes.    IF you received an x-ray today, you will receive an invoice from [facility]. Please contact [facility] at [phone] with questions or concerns regarding your invoice.   IF you received labwork today, you will receive an invoice from [facility]. Please contact [facility] at [phone] with questions or concerns regarding your invoice.   Our billing staff will not be able to assist you with questions regarding bills from these companies.  You will be contacted with the  lab results as soon as they are available. The fastest way to get your results is to activate your My Chart account. Instructions are located on the last page of this paperwork. If you have not heard from [place] regarding the results in 2 weeks, please contact this office.     Signed,   Meredith Staggers, MD Primary Care at [facility].  02/27/17 12:34 PM

## 2017-02-27 ENCOUNTER — Other Ambulatory Visit: Payer: Self-pay | Admitting: Family Medicine

## 2017-02-27 ENCOUNTER — Encounter: Payer: Self-pay | Admitting: Family Medicine

## 2017-02-27 DIAGNOSIS — E119 Type 2 diabetes mellitus without complications: Secondary | ICD-10-CM

## 2017-02-27 LAB — COMPREHENSIVE METABOLIC PANEL
ALT: 21 IU/L (ref 0–32)
AST: 22 IU/L (ref 0–40)
Albumin/Globulin Ratio: 1.5 (ref 1.2–2.2)
Albumin: 4.2 g/dL (ref 3.5–5.5)
Alkaline Phosphatase: 87 IU/L (ref 39–117)
BUN/Creatinine Ratio: 20 (ref 9–23)
BUN: 16 mg/dL (ref 6–24)
Bilirubin Total: <0.2 mg/dL (ref 0.0–1.2)
CO2: 25 mmol/L (ref 20–29)
Calcium: 9 mg/dL (ref 8.7–10.2)
Chloride: 102 mmol/L (ref 96–106)
Creatinine, Ser: 0.82 mg/dL (ref 0.57–1.00)
GFR calc Af Amer: 95 mL/min/{1.73_m2} (ref >59)
GFR calc non Af Amer: 83 mL/min/{1.73_m2} (ref >59)
Globulin, Total: 2.8 g/dL (ref 1.5–4.5)
Glucose: 124 mg/dL — ABNORMAL HIGH (ref 65–99)
Potassium: 3.9 mmol/L (ref 3.5–5.2)
Sodium: 141 mmol/L (ref 134–144)
Total Protein: 7 g/dL (ref 6.0–8.5)

## 2017-02-27 LAB — LIPID PANEL
Chol/HDL Ratio: 2.4 ratio (ref 0.0–4.4)
Cholesterol, Total: 120 mg/dL (ref 100–199)
HDL: 49 mg/dL (ref >39)
LDL Calculated: 55 mg/dL (ref 0–99)
Triglycerides: 82 mg/dL (ref 0–149)
VLDL Cholesterol Cal: 16 mg/dL (ref 5–40)

## 2017-02-27 LAB — HEMOGLOBIN A1C
Est. average glucose Bld gHb Est-mCnc: 183 mg/dL
Hgb A1c MFr Bld: 8 % — ABNORMAL HIGH (ref 4.8–5.6)

## 2017-02-27 MED ORDER — ATORVASTATIN CALCIUM 20 MG PO TABS: 20.0000 mg | ORAL_TABLET | Freq: Every day | ORAL | 5 refills | Status: DC

## 2017-02-27 MED ORDER — AMLODIPINE BESYLATE 5 MG PO TABS: 5.0000 mg | ORAL_TABLET | Freq: Every day | ORAL | 5 refills | Status: DC

## 2017-02-27 MED ORDER — METFORMIN HCL 500 MG PO TABS: 500.0000 mg | ORAL_TABLET | Freq: Every day | ORAL | 5 refills | Status: DC

## 2017-02-27 MED ORDER — BENAZEPRIL HCL 40 MG PO TABS
40.0000 mg | ORAL_TABLET | Freq: Every day | ORAL | 5 refills | Status: DC
Start: 2017-02-27 — End: 2017-06-01

## 2017-02-27 NOTE — Telephone Encounter (Signed)
Phone call to [facility] in [address]. Spoke with pharmacy. Confirmed medication was sent in, rx refill request not necessary. Closing note.

## 2017-03-03 LAB — HM DIABETES EYE EXAM

## 2017-05-10 DIAGNOSIS — Z23 Encounter for immunization: Secondary | ICD-10-CM | POA: Diagnosis not present

## 2017-05-31 ENCOUNTER — Ambulatory Visit: Payer: 59 | Admitting: Family Medicine

## 2017-06-01 ENCOUNTER — Ambulatory Visit: Payer: 59 | Admitting: Family Medicine

## 2017-06-01 ENCOUNTER — Encounter: Payer: Self-pay | Admitting: Family Medicine

## 2017-06-01 ENCOUNTER — Other Ambulatory Visit: Payer: Self-pay

## 2017-06-01 VITALS — BP 138/78 | HR 75 | Temp 98.4°F | Ht 66.0 in | Wt 194.4 lb

## 2017-06-01 DIAGNOSIS — E119 Type 2 diabetes mellitus without complications: Secondary | ICD-10-CM

## 2017-06-01 DIAGNOSIS — I10 Essential (primary) hypertension: Secondary | ICD-10-CM

## 2017-06-01 DIAGNOSIS — E785 Hyperlipidemia, unspecified: Secondary | ICD-10-CM

## 2017-06-01 DIAGNOSIS — Z23 Encounter for immunization: Secondary | ICD-10-CM | POA: Diagnosis not present

## 2017-06-01 DIAGNOSIS — J309 Allergic rhinitis, unspecified: Secondary | ICD-10-CM

## 2017-06-01 MED ORDER — AMLODIPINE BESYLATE 5 MG PO TABS: 5.0000 mg | ORAL_TABLET | Freq: Every day | ORAL | 5 refills | Status: DC

## 2017-06-01 MED ORDER — METFORMIN HCL 500 MG PO TABS: 500.0000 mg | ORAL_TABLET | Freq: Two times a day (BID) | ORAL | 5 refills | Status: DC

## 2017-06-01 MED ORDER — BENAZEPRIL HCL 40 MG PO TABS: 40.0000 mg | ORAL_TABLET | Freq: Every day | ORAL | 5 refills | Status: DC

## 2017-06-01 MED ORDER — ATORVASTATIN CALCIUM 20 MG PO TABS: 20.0000 mg | ORAL_TABLET | Freq: Every day | ORAL | 5 refills | Status: DC

## 2017-06-01 NOTE — Patient Instructions (Addendum)
   Keep up the good work with exercise and watching diet for blood sugar control.  Continue metformin twice per day for now.  I will check your hemoglobin A1c to determine if other recommendations or medication changes are needed.    Continue blood pressure medication and cholesterol medication at same doses for now and follow-up in 3 months.  Thank you for coming in today.  IF you received an x-ray today, you will receive an invoice from [facility]. Please contact [facility] at [phone] with questions or concerns regarding your invoice.   IF you received labwork today, you will receive an invoice from [place]. Please contact [facility] at [phone] with questions or concerns regarding your invoice.   Our billing staff will not be able to assist you with questions regarding bills from these companies.  You will be contacted with the lab results as soon as they are available. The fastest way to get your results is to activate your My Chart account. Instructions are located on the last page of this paperwork. If you have not heard from [place] regarding the results in 2 weeks, please contact this office.

## 2017-06-01 NOTE — Progress Notes (Signed)
Subjective:  By signing my name below, I, Robin Delacruz, attest that this documentation has been prepared under the direction and in the presence of Robin Flood, MD Electronically Signed: Charline Bills, ED Scribe 06/01/2017 at 12:15 PM.   Patient ID: Robin Delacruz, female    DOB: 09-09-1965, 52 y.o.   MRN: [id number]  Chief Complaint  Patient presents with  . Chronic Conditions    3 month follow up  . Medication Refill    all meds   HPI Robin Delacruz is a 52 y.o. female who presents to Primary Care at [facility] for f/u.  DM Lab Results  Component Value Date   HGBA1C 8.0 (H) 02/26/2017  Urine micro albumin 10.8 in 06/2016. Optho: 02/2017. Pneumovax 2014. Foot exam: 06/05/16. She is on ACE inhibitor, stain and Aspirin. Fasting readings were around 115, post prandial 175. Advised to increase Metformin to bid. 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 States she had 3 slices of pizza, checked her blood glucose the following morning with a reading in the 300s. She has noticed some dry mouth with Metformin but denies any new side-effects. She has been exercising. Wt Readings from Last 3 Encounters:  06/01/17 194 lb 6.4 oz (88.2 kg)  02/26/17 200 lb (90.7 kg)  08/10/16 199 lb (90.3 kg)   HTN Norvasc 5 mg qd and Lotensin 40 mg qd. Lab Results  Component Value Date   CREATININE 0.82 02/26/2017   BP Readings from Last 3 Encounters:  06/01/17 138/78  02/26/17 140/80  08/10/16 126/80   Hyperlipidemia Lab Results  Component Value Date   CHOL 120 02/26/2017   HDL 49 02/26/2017   LDLCALC 55 02/26/2017   TRIG 82 02/26/2017   CHOLHDL 2.4 02/26/2017   Lab Results  Component Value Date   ALT 21 02/26/2017   AST 22 02/26/2017   ALKPHOS 87 02/26/2017   BILITOT <0.2 02/26/2017  Lipitor 20 mg qd.  Allergies Pt has been using Zyrtec for allergies.   Health Maintenance Shingles: pt has not had  shingles vaccine yet.  Allergic Reaction Pt recently discovered that she is allergic to Codeine following dental infection - anaphylaxis and hives  Patient Active Problem List   Diagnosis Date Noted  . Type 2 diabetes mellitus without complication (HCC) 03/05/2014  . Ovarian cyst   . Anemia   . Dyspnea   . Fatigue   . Chest pain 11/02/2011  . Hypertension    Past Medical History:  Diagnosis Date  . Allergy    seasonal  . Anemia   . Chest pain   . Diabetes mellitus    Diagnosed at age 58  . Dyspnea   . Fatigue   . Hypertension   . Ovarian cyst    Past Surgical History:  Procedure Laterality Date  . ABLATION    . CESAREAN SECTION    . TONSILLECTOMY    . TUBAL LIGATION     Allergies  Allergen Reactions  . Codeine Anaphylaxis and Hives  . Penicillins Anaphylaxis and Hives  . Sulfa Antibiotics Anaphylaxis and Hives   Prior to Admission medications   Medication Sig Start Date End Date Taking? Authorizing Provider  albuterol (PROVENTIL HFA;VENTOLIN HFA) 108 (90 Base) MCG/ACT inhaler Inhale 1-2 puffs into the lungs every 4 (four) hours as needed for wheezing or shortness of breath (cough, shortness of breath or wheezing.). 06/05/16   Robin Flood, MD  amLODipine (NORVASC) 5 MG tablet Take 1 tablet (5 mg total) by mouth daily. 02/27/17   Robin Flood, MD  aspirin EC 81 MG tablet Take 1 tablet (81 mg total) by mouth daily. 11/02/11   [name], MD  atorvastatin (LIPITOR) 20 MG tablet Take 1 tablet (20 mg total) by mouth daily. 02/27/17   Robin Flood, MD  benazepril (LOTENSIN) 40 MG tablet Take 1 tablet (40 mg total) by mouth daily. 02/27/17   Robin Flood, MD  ferrous sulfate 325 (65 FE) MG tablet Take 1 tablet (325 mg total) by mouth daily with breakfast. 10/29/11   Robin Flood, MD  metFORMIN (GLUCOPHAGE) 500 MG tablet Take 1 tablet (500 mg total) by mouth daily with breakfast. 02/27/17   Robin Flood, MD   Social History   Socioeconomic  History  . Marital status: Divorced    Spouse name: Not on file  . Number of children: 2  . Years of education: Not on file  . Highest education level: Not on file  Occupational History  . 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 Financial resource strain: Not on file  . Food insecurity:    Worry: Not on file    Inability: Not on file  . Transportation needs:    Medical: Not on file    Non-medical: Not on file  Tobacco Use  . Smoking status: Never Smoker  . Smokeless tobacco: Never Used  Substance and Sexual Activity  . Alcohol use: No    Alcohol/week: 0.0 oz  . Drug use: No  . Sexual activity: Never  Lifestyle  . Physical activity:    Days per week: Not on file    Minutes per session: Not on file  . Stress: Not on file  Relationships  . Social connections:    Talks on phone: Not on file    Gets together: Not on file    Attends religious service: Not on file    Active member of club or organization: Not on file    Attends meetings of clubs or organizations: Not on file    Relationship status: Not on file  . Intimate partner violence:    Fear of current or ex partner: Not on file    Emotionally abused: Not on file    Physically abused: Not on file    Forced sexual activity: Not on file  Other Topics Concern  . Not on file  Social History Narrative  . Not on file   Review of Systems  Constitutional: Negative for fatigue and unexpected weight change.  Respiratory: Negative for chest tightness and shortness of breath.   Cardiovascular: Negative for chest pain, palpitations and leg swelling.  Gastrointestinal: Negative for abdominal pain, blood in stool, constipation and diarrhea.  Endocrine: Positive for polydipsia.  Allergic/Immunologic: Positive for environmental allergies.  Neurological: Negative for dizziness, syncope, light-headedness, numbness and headaches.      Objective:   Physical Exam  Constitutional: She is oriented to  person, place, and time. She appears well-developed and well-nourished.  HENT:  Head: Normocephalic and atraumatic.  Eyes: Pupils are equal, round, and reactive to light. Conjunctivae and EOM are normal.  Neck: Carotid bruit is not present.  Cardiovascular: Normal rate, regular rhythm, normal heart sounds and intact distal pulses.  Pulmonary/Chest: Effort normal and breath sounds normal.  Abdominal: Soft. She exhibits no pulsatile midline mass. There is no tenderness.  Neurological: She is alert  and oriented to person, place, and time.  Skin: Skin is warm and dry.  Psychiatric: She has a normal mood and affect. Her behavior is normal.  Vitals reviewed.  Vitals:   06/01/17 1149  BP: 138/78  Pulse: 75  Temp: 98.4 F (36.9 C)  TempSrc: Oral  SpO2: 98%  Weight: 194 lb 6.4 oz (88.2 kg)  Height:  (1.676 m)      Assessment & Plan:   Merian Wroe is a 52 y.o. female Type 2 diabetes mellitus without complication, without long-term current use of insulin (HCC) - Plan: Microalbumin, urine, Hemoglobin A1c, metFORMIN (GLUCOPHAGE) 500 MG tablet, benazepril (LOTENSIN) 40 MG tablet  -Check A1c, continue same dose of metformin for now.  Ideally would like to see her in the low 6 range for A1c.  Options discussed if levels elevated.  Recheck in 3 months.  Essential hypertension - Plan: benazepril (LOTENSIN) 40 MG tablet, amLODipine (NORVASC) 5 MG tablet  -Borderline elevated for goal with diabetes, but will continue same regimen for now.  Continue exercise/physical activity for diabetes, blood pressure as well as stress management  Allergic rhinitis, unspecified seasonality, unspecified trigger Over-the-counter treatments as needed.  RTC precautions.   Hyperlipidemia, unspecified hyperlipidemia type - Plan: atorvastatin (LIPITOR) 20 MG tablet  -tolerating Lipitor, continue same dose, no changes for now.    Meds ordered this encounter  Medications  . metFORMIN (GLUCOPHAGE) 500 MG  tablet    Sig: Take 1 tablet (500 mg total) by mouth 2 (two) times daily with a meal.    Dispense:  60 tablet    Refill:  5  . benazepril (LOTENSIN) 40 MG tablet    Sig: Take 1 tablet (40 mg total) by mouth daily.    Dispense:  30 tablet    Refill:  5  . atorvastatin (LIPITOR) 20 MG tablet    Sig: Take 1 tablet (20 mg total) by mouth daily.    Dispense:  30 tablet    Refill:  5  . amLODipine (NORVASC) 5 MG tablet    Sig: Take 1 tablet (5 mg total) by mouth daily.    Dispense:  30 tablet    Refill:  5   Patient Instructions     Keep up the good work with exercise and watching diet for blood sugar control.  Continue metformin twice per day for now.  I will check your hemoglobin A1c to determine if other recommendations or medication changes are needed.    Continue blood pressure medication and cholesterol medication at same doses for now and follow-up in 3 months.  Thank you for coming in today.  IF you received an x-ray today, you will receive an invoice from [facility]. Please contact [facility] at [phone] with questions or concerns regarding your invoice.   IF you received labwork today, you will receive an invoice from [place]. Please contact [facility] at [phone] with questions or concerns regarding your invoice.   Our billing staff will not be able to assist you with questions regarding bills from these companies.  You will be contacted with the lab results as soon as they are available. The fastest way to get your results is to activate your My Chart account. Instructions are located on the last page of this paperwork. If you have not heard from [place] regarding the results in 2 weeks, please contact this office.       I personally performed the services described in this documentation, which was scribed in my presence.  The recorded information has been reviewed and considered for accuracy and completeness, addended by me as needed, and agree with  information above.  Signed,   Meredith Staggers, MD Primary Care at [facility].  06/02/17 9:46 PM

## 2017-06-02 ENCOUNTER — Encounter: Payer: Self-pay | Admitting: Family Medicine

## 2017-06-02 LAB — HEMOGLOBIN A1C
Est. average glucose Bld gHb Est-mCnc: 151 mg/dL
Hgb A1c MFr Bld: 6.9 % — ABNORMAL HIGH (ref 4.8–5.6)

## 2017-06-02 LAB — MICROALBUMIN, URINE: Microalbumin, Urine: 25.2 ug/mL

## 2017-08-13 DIAGNOSIS — N3000 Acute cystitis without hematuria: Secondary | ICD-10-CM | POA: Diagnosis not present

## 2017-09-02 ENCOUNTER — Encounter: Payer: Self-pay | Admitting: Family Medicine

## 2017-09-02 ENCOUNTER — Ambulatory Visit: Payer: 59 | Admitting: Family Medicine

## 2017-09-02 ENCOUNTER — Other Ambulatory Visit: Payer: Self-pay

## 2017-09-02 VITALS — BP 138/80 | HR 78 | Temp 98.3°F | Ht 66.0 in | Wt 195.6 lb

## 2017-09-02 DIAGNOSIS — M25579 Pain in unspecified ankle and joints of unspecified foot: Secondary | ICD-10-CM | POA: Diagnosis not present

## 2017-09-02 DIAGNOSIS — I10 Essential (primary) hypertension: Secondary | ICD-10-CM | POA: Diagnosis not present

## 2017-09-02 DIAGNOSIS — R238 Other skin changes: Secondary | ICD-10-CM

## 2017-09-02 DIAGNOSIS — E119 Type 2 diabetes mellitus without complications: Secondary | ICD-10-CM | POA: Diagnosis not present

## 2017-09-02 DIAGNOSIS — E785 Hyperlipidemia, unspecified: Secondary | ICD-10-CM | POA: Diagnosis not present

## 2017-09-02 LAB — COMPREHENSIVE METABOLIC PANEL
ALT: 23 IU/L (ref 0–32)
AST: 22 IU/L (ref 0–40)
Albumin/Globulin Ratio: 1.4 (ref 1.2–2.2)
Albumin: 4.2 g/dL (ref 3.5–5.5)
Alkaline Phosphatase: 87 IU/L (ref 39–117)
BUN/Creatinine Ratio: 15 (ref 9–23)
BUN: 14 mg/dL (ref 6–24)
Bilirubin Total: 0.4 mg/dL (ref 0.0–1.2)
CO2: 23 mmol/L (ref 20–29)
Calcium: 9.5 mg/dL (ref 8.7–10.2)
Chloride: 96 mmol/L (ref 96–106)
Creatinine, Ser: 0.91 mg/dL (ref 0.57–1.00)
GFR calc Af Amer: 83 mL/min/{1.73_m2} (ref >59)
GFR calc non Af Amer: 72 mL/min/{1.73_m2} (ref >59)
Globulin, Total: 2.9 g/dL (ref 1.5–4.5)
Glucose: 141 mg/dL — ABNORMAL HIGH (ref 65–99)
Potassium: 4.1 mmol/L (ref 3.5–5.2)
Sodium: 135 mmol/L (ref 134–144)
Total Protein: 7.1 g/dL (ref 6.0–8.5)

## 2017-09-02 LAB — HEMOGLOBIN A1C
Est. average glucose Bld gHb Est-mCnc: 166 mg/dL
Hgb A1c MFr Bld: 7.4 % — ABNORMAL HIGH (ref 4.8–5.6)

## 2017-09-02 LAB — LIPID PANEL
Chol/HDL Ratio: 2.7 ratio (ref 0.0–4.4)
Cholesterol, Total: 128 mg/dL (ref 100–199)
HDL: 47 mg/dL (ref >39)
LDL Calculated: 58 mg/dL (ref 0–99)
Triglycerides: 113 mg/dL (ref 0–149)
VLDL Cholesterol Cal: 23 mg/dL (ref 5–40)

## 2017-09-02 NOTE — Patient Instructions (Addendum)
Continue localized cleansing for blisters and see info below. If foot soreness is not continuing to improve in next week or two, please return to discuss further.   I will check labs, try alarm or meds near toothbrush to remember 2nd dose.  Continue exercise most days per week.   Please follow up to discuss muscle spasm and neck symptoms further as we may need imaging or further workup.   Thanks for coming in today.    Blisters, Adult A blister is a raised bubble of skin filled with liquid. Blisters often develop in an area of the skin that repeatedly rubs or presses against another surface (friction blister). Friction blisters can occur on any part of the body, but they usually develop on the hands or feet. Long-term pressure on the same area of the skin can also lead to areas of hardened skin (calluses). What are the causes? A blister can be caused by:  An injury.  A burn.  An allergic reaction.  An infection.  Exposure to irritating chemicals.  Friction, especially in an area with a lot of heat and moisture.  Friction blisters often result from:  Sports.  Repetitive activities.  Using tools and doing other activities without wearing gloves.  Shoes that are too tight or too loose.  What are the signs or symptoms? A blister is often round and looks like a bump. It may:  Itch.  Be painful to the touch.  Before a blister forms, the skin may:  Become red.  Feel warm.  Itch.  Be painful to the touch.  How is this diagnosed? A blister is diagnosed with a physical exam. How is this treated? Treatment usually involves protecting the area where the blister has formed until the skin has healed. Other treatments may include:  A bandage (dressing) to cover the blister.  Extra padding around and over the blister, so that it does not rub on anything.  Antibiotic ointment.  Most blisters break open, dry up, and go away on their own within 1-2 weeks. Blisters that  are very painful may be drained before they break open on their own. If the blister is large or painful, it can be drained by: 1. Sterilizing a small needle with rubbing alcohol. 2. Washing your hands with soap and water. 3. Inserting the needle in the edge of the blister to make a small hole. Some fluid will drain out of the hole. Let the top or roof of the blister stay in place. This helps the skin heal. 4. Washing the blister with mild soap and water. 5. Covering the blister with antibiotic ointment, if prescribed by your health care provider, and a dressing.  Some blisters may need to be drained by a health care provider. Follow these instructions at home:  Protect the area where the blister has formed as told by your health care provider.  Keep your blister clean and dry. This helps to prevent infection.  Do not pop your blister. This can cause infection.  If you were prescribed an antibiotic, use it as told by your health care provider. Do not stop using the antibiotic even if your condition improves.  Wear different shoes until the blister heals.  Avoid the activity that caused the blister until your blister heals.  Check your blister every day for signs of infection. Check for: ? More redness, swelling, or pain. ? More fluid or blood. ? Warmth. ? Pus or a bad smell. ? The blister getting better and then  getting worse. How is this prevented? Taking these steps can help to prevent blisters that are caused by friction. Make sure you:  Wear comfortable shoes that fit well.  Always wear socks with shoes.  Wear extra socks or use tape, bandages, or pads over blister-prone areas as needed. You may also apply petroleum jelly under bandages in blister-prone areas.  Wear protective gear, such as gloves, when participating in sports or activities that can cause blisters.  Wear loose-fitting, moisture-wicking clothes when participating in sports or activities.  Use powders as  needed to keep your feet dry.  Contact a health care provider if:  You have more redness, swelling, or pain around your blister.  You have more fluid or blood coming from your blister.  Your blister feels warm to the touch.  You have pus or a bad smell coming from your blister.  You have a fever or chills.  Your blister gets better and then it gets worse. This information is not intended to replace advice given to you by your health care provider. Make sure you discuss any questions you have with your health care provider. Document Released: 02/27/2004 Document Revised: 09/18/2015 Document Reviewed: 08/02/2015 Elsevier Interactive Patient Education  2018 [employer].     IF you received an x-ray today, you will receive an invoice from [facility]. Please contact [facility] at [phone] with questions or concerns regarding your invoice.   IF you received labwork today, you will receive an invoice from [place]. Please contact [facility] at [phone] with questions or concerns regarding your invoice.   Our billing staff will not be able to assist you with questions regarding bills from these companies.  You will be contacted with the lab results as soon as they are available. The fastest way to get your results is to activate your My Chart account. Instructions are located on the last page of this paperwork. If you have not heard from [place] regarding the results in 2 weeks, please contact this office.

## 2017-09-02 NOTE — Progress Notes (Signed)
Subjective:    Patient ID: Robin Delacruz, female    DOB: 04-15-1965, 52 y.o.   MRN: [id number]  HPI Robin Delacruz is a 52 y.o. female Presents today for: Chief Complaint  Patient presents with  . Diabetes    3 month   Diabetes: Lab Results  Component Value Date   HGBA1C 6.9 (H) 06/01/2017   Wt Readings from Last 3 Encounters:  09/02/17 195 lb 9.6 oz (88.7 kg)  06/01/17 194 lb 6.4 oz (88.2 kg)  02/26/17 200 lb (90.7 kg)  Pneumonia vaccine 05/20/2017, ophthalmology exam 03/03/2017, foot exam: Diabetic Foot Exam - Simple   No data filed    Continue same dose of metformin last visit.  Ideally would like to see her below 6.5.  Has continued on metformin 500 mg twice daily. Misses night dose every few days. No new side effects or GI intolerance.   Just returned from [place] and [place] on the 22nd. Lots of walking.  Has some blisters on feet due to more walking. Wore sandals.  Soap and water, and triple antibioitic.no exudate or surrounding erythema.   Walking at work, some pushups at home.   Hypertension: BP Readings from Last 3 Encounters:  09/02/17 138/80  06/01/17 138/78  02/26/17 140/80   Lab Results  Component Value Date   CREATININE 0.82 02/26/2017  Borderline last visit, but continued on Lotensin and Norvasc same doses.  home bp around 120/80.  Hyperlipidemia:  Lab Results  Component Value Date   CHOL 120 02/26/2017   HDL 49 02/26/2017   LDLCALC 55 02/26/2017   TRIG 82 02/26/2017   CHOLHDL 2.4 02/26/2017   Lab Results  Component Value Date   ALT 21 02/26/2017   AST 22 02/26/2017   ALKPHOS 87 02/26/2017   BILITOT <0.2 02/26/2017   She was continued on Lipitor 20 mg daily last visit.  Goal of less than 70 LDL was obtained when checked in January. No new myalgias or side effects. Creamer and equal in coffee this morning, otherwisre fasting.   Patient Active Problem List   Diagnosis Date Noted  . Type 2 diabetes mellitus without complication (HCC)  03/05/2014  . Ovarian cyst   . Anemia   . Dyspnea   . Fatigue   . Chest pain 11/02/2011  . Hypertension    Past Medical History:  Diagnosis Date  . Allergy    seasonal  . Anemia   . Chest pain   . Diabetes mellitus    Diagnosed at age 74  . Dyspnea   . Fatigue   . Hypertension   . Ovarian cyst    Past Surgical History:  Procedure Laterality Date  . ABLATION    . CESAREAN SECTION    . TONSILLECTOMY    . TUBAL LIGATION     Allergies  Allergen Reactions  . Artichoke [Cynara Scolymus (Artichoke)] Hives  . Codeine Anaphylaxis and Hives  . Penicillins Anaphylaxis and Hives  . Sulfa Antibiotics Anaphylaxis and Hives   Prior to Admission medications   Medication Sig Start Date End Date Taking? Authorizing Provider  albuterol (PROVENTIL HFA;VENTOLIN HFA) 108 (90 Base) MCG/ACT inhaler Inhale 1-2 puffs into the lungs every 4 (four) hours as needed for wheezing or shortness of breath (cough, shortness of breath or wheezing.). 06/05/16  Yes [name], MD  amLODipine (NORVASC) 5 MG tablet Take 1 tablet (5 mg total) by mouth daily. 06/01/17  Yes [name], MD  aspirin EC 81 MG tablet Take 1 tablet (  81 mg total) by mouth daily. 11/02/11  Yes Lewayne Buntingrenshaw, Brian S, MD  atorvastatin (LIPITOR) 20 MG tablet Take 1 tablet (20 mg total) by mouth daily. 06/01/17  Yes Shade FloodGreene, Jordie Skalsky R, MD  benazepril (LOTENSIN) 40 MG tablet Take 1 tablet (40 mg total) by mouth daily. 06/01/17  Yes Shade FloodGreene, Dynastie Knoop R, MD  ferrous sulfate 325 (65 FE) MG tablet Take 1 tablet (325 mg total) by mouth daily with breakfast. 10/29/11  Yes Shade FloodGreene, Flavius Repsher R, MD  metFORMIN (GLUCOPHAGE) 500 MG tablet Take 1 tablet (500 mg total) by mouth 2 (two) times daily with a meal. 06/01/17  Yes Shade FloodGreene, Hurley Sobel R, MD   Social History   Socioeconomic History  . Marital status: Divorced    Spouse name: Not on file  . Number of children: 2  . Years of education: Not on file  . Highest education level: Not on file    Occupational History  . 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 Financial resource strain: Not on file  . Food insecurity:    Worry: Not on file    Inability: Not on file  . Transportation needs:    Medical: Not on file    Non-medical: Not on file  Tobacco Use  . Smoking status: Never Smoker  . Smokeless tobacco: Never Used  Substance and Sexual Activity  . Alcohol use: No    Alcohol/week: 0.0 oz  . Drug use: No  . Sexual activity: Never  Lifestyle  . Physical activity:    Days per week: Not on file    Minutes per session: Not on file  . Stress: Not on file  Relationships  . Social connections:    Talks on phone: Not on file    Gets together: Not on file    Attends religious service: Not on file    Active member of club or organization: Not on file    Attends meetings of clubs or organizations: Not on file    Relationship status: Not on file  . Intimate partner violence:    Fear of current or ex partner: Not on file    Emotionally abused: Not on file    Physically abused: Not on file    Forced sexual activity: Not on file  Other Topics Concern  . Not on file  Social History Narrative  . Not on file    Review of Systems  Constitutional: Negative for fatigue and unexpected weight change.  Respiratory: Negative for chest tightness and shortness of breath.   Cardiovascular: Negative for chest pain, palpitations and leg swelling.  Gastrointestinal: Negative for abdominal pain and blood in stool.  Neurological: Negative for dizziness, syncope, light-headedness and headaches.   Per hpi.     Objective:   Physical Exam  Constitutional: She is oriented to person, place, and time. She appears well-developed and well-nourished.  HENT:  Head: Normocephalic and atraumatic.  Eyes: Pupils are equal, round, and reactive to light. Conjunctivae and EOM are normal.  Neck: Carotid bruit is not present.  Cardiovascular: Normal rate, regular  rhythm, normal heart sounds and intact distal pulses.  Pulmonary/Chest: Effort normal and breath sounds normal.  Abdominal: Soft. She exhibits no pulsatile midline mass. There is no tenderness.  Musculoskeletal:       Feet:  Neurological: She is alert and oriented to person, place, and time.  Skin: Skin is warm and dry.  Psychiatric: She has a normal mood and affect. Her behavior is  normal.  Vitals reviewed.  Vitals:   09/02/17 1042 09/02/17 1049  BP: (!) 154/94 138/80  Pulse: 78   Temp: 98.3 F (36.8 C)   TempSrc: Oral   SpO2: 97%   Weight: 195 lb 9.6 oz (88.7 kg)   Height: 5\' 6"  (1.676 m)        Assessment & Plan:   Robin Delacruz is a 52 y.o. female Type 2 diabetes mellitus without complication, without long-term current use of insulin (HCC) - Plan: HM Diabetes Foot Exam, Hemoglobin A1c  -Tolerating current management, tips discussed for remembering second dose of metformin.  Check A1c, no changes for now  Hyperlipidemia, unspecified hyperlipidemia type - Plan: Lipid panel, Comprehensive metabolic panel  -Check labs, continue same regimen for now.  Essential hypertension  - stable on recheck.  No changes to regimen at this time. Same dose lipitor for now.   Pain in joint involving ankle and foot, unspecified laterality Blisters of multiple sites  -Suspected overuse with healing blisters.  Continue symptomatic care, RTC precautions if continued discomfort, especially at bone to rule out stress injury.  Plans to follow-up neck symptoms/muscle spasm further at separate visit  No orders of the defined types were placed in this encounter.  Patient Instructions    Continue localized cleansing for blisters and see info below. If foot soreness is not continuing to improve in next week or two, please return to discuss further.   I will check labs, try alarm or meds near toothbrush to remember 2nd dose.  Continue exercise most days per week.   Please follow up to  discuss muscle spasm or neck symptoms further.    Blisters, Adult A blister is a raised bubble of skin filled with liquid. Blisters often develop in an area of the skin that repeatedly rubs or presses against another surface (friction blister). Friction blisters can occur on any part of the body, but they usually develop on the hands or feet. Long-term pressure on the same area of the skin can also lead to areas of hardened skin (calluses). What are the causes? A blister can be caused by:  An injury.  A burn.  An allergic reaction.  An infection.  Exposure to irritating chemicals.  Friction, especially in an area with a lot of heat and moisture.  Friction blisters often result from:  Sports.  Repetitive activities.  Using tools and doing other activities without wearing gloves.  Shoes that are too tight or too loose.  What are the signs or symptoms? A blister is often round and looks like a bump. It may:  Itch.  Be painful to the touch.  Before a blister forms, the skin may:  Become red.  Feel warm.  Itch.  Be painful to the touch.  How is this diagnosed? A blister is diagnosed with a physical exam. How is this treated? Treatment usually involves protecting the area where the blister has formed until the skin has healed. Other treatments may include:  A bandage (dressing) to cover the blister.  Extra padding around and over the blister, so that it does not rub on anything.  Antibiotic ointment.  Most blisters break open, dry up, and go away on their own within 1-2 weeks. Blisters that are very painful may be drained before they break open on their own. If the blister is large or painful, it can be drained by: 1. Sterilizing a small needle with rubbing alcohol. 2. Washing your hands with soap and water. 3. Inserting the  needle in the edge of the blister to make a small hole. Some fluid will drain out of the hole. Let the top or roof of the blister stay in  place. This helps the skin heal. 4. Washing the blister with mild soap and water. 5. Covering the blister with antibiotic ointment, if prescribed by your health care provider, and a dressing.  Some blisters may need to be drained by a health care provider. Follow these instructions at home:  Protect the area where the blister has formed as told by your health care provider.  Keep your blister clean and dry. This helps to prevent infection.  Do not pop your blister. This can cause infection.  If you were prescribed an antibiotic, use it as told by your health care provider. Do not stop using the antibiotic even if your condition improves.  Wear different shoes until the blister heals.  Avoid the activity that caused the blister until your blister heals.  Check your blister every day for signs of infection. Check for: ? More redness, swelling, or pain. ? More fluid or blood. ? Warmth. ? Pus or a bad smell. ? The blister getting better and then getting worse. How is this prevented? Taking these steps can help to prevent blisters that are caused by friction. Make sure you:  Wear comfortable shoes that fit well.  Always wear socks with shoes.  Wear extra socks or use tape, bandages, or pads over blister-prone areas as needed. You may also apply petroleum jelly under bandages in blister-prone areas.  Wear protective gear, such as gloves, when participating in sports or activities that can cause blisters.  Wear loose-fitting, moisture-wicking clothes when participating in sports or activities.  Use powders as needed to keep your feet dry.  Contact a health care provider if:  You have more redness, swelling, or pain around your blister.  You have more fluid or blood coming from your blister.  Your blister feels warm to the touch.  You have pus or a bad smell coming from your blister.  You have a fever or chills.  Your blister gets better and then it gets worse. This  information is not intended to replace advice given to you by your health care provider. Make sure you discuss any questions you have with your health care provider. Document Released: 02/27/2004 Document Revised: 09/18/2015 Document Reviewed: 08/02/2015 Elsevier Interactive Patient Education  2018 [employer].     IF you received an x-ray today, you will receive an invoice from [facility]. Please contact [facility] at [phone] with questions or concerns regarding your invoice.   IF you received labwork today, you will receive an invoice from [place]. Please contact [facility] at [phone] with questions or concerns regarding your invoice.   Our billing staff will not be able to assist you with questions regarding bills from these companies.  You will be contacted with the lab results as soon as they are available. The fastest way to get your results is to activate your My Chart account. Instructions are located on the last page of this paperwork. If you have not heard from [place] regarding the results in 2 weeks, please contact this office.       Signed,   Meredith Staggers, MD Primary Care at [facility].  09/05/17 11:13 AM

## 2017-09-09 ENCOUNTER — Ambulatory Visit: Payer: 59 | Admitting: Family Medicine

## 2017-09-10 ENCOUNTER — Telehealth: Payer: Self-pay | Admitting: Family Medicine

## 2017-09-10 NOTE — Telephone Encounter (Signed)
Left a voicemail for Ms. Dicesare in regards to her appt she has with Dr. Neva SeatGreene on 12/09/2017. The provider will not be in the office on that day and would need to be rescheduled.

## 2017-09-16 ENCOUNTER — Ambulatory Visit (INDEPENDENT_AMBULATORY_CARE_PROVIDER_SITE_OTHER): Payer: 59

## 2017-09-16 ENCOUNTER — Encounter: Payer: Self-pay | Admitting: Family Medicine

## 2017-09-16 ENCOUNTER — Ambulatory Visit: Payer: 59 | Admitting: Family Medicine

## 2017-09-16 ENCOUNTER — Other Ambulatory Visit: Payer: Self-pay

## 2017-09-16 VITALS — BP 132/78 | HR 79 | Temp 98.4°F | Ht 66.5 in | Wt 196.4 lb

## 2017-09-16 DIAGNOSIS — M542 Cervicalgia: Secondary | ICD-10-CM | POA: Diagnosis not present

## 2017-09-16 DIAGNOSIS — M545 Low back pain, unspecified: Secondary | ICD-10-CM

## 2017-09-16 DIAGNOSIS — M5412 Radiculopathy, cervical region: Secondary | ICD-10-CM

## 2017-09-16 MED ORDER — CYCLOBENZAPRINE HCL 5 MG PO TABS
5.0000 mg | ORAL_TABLET | Freq: Three times a day (TID) | ORAL | 1 refills | Status: DC | PRN
Start: 2017-09-16 — End: 2019-03-16

## 2017-09-16 NOTE — Patient Instructions (Addendum)
Neck and arm symptoms are likely due to spasm and pinched nerve, causing cervical radiculopathy.  See information below on this condition.  As symptoms are intermittent, it may be worth trying physical therapy initially.  Additionally can use ibuprofen short-term as needed with food and muscle relaxant was prescribed as needed.  Start that at bedtime due to sedation.    If your symptoms are worsening, we could consider a course of prednisone, MRI, or orthopedic referral.  Return to the clinic or go to the nearest emergency room if any of your symptoms worsen or new symptoms occur.  See other information below on low back pain.  Physical therapy, stretches may also help with that condition as well as the muscle relaxant.  Recheck in 3 to 4 weeks.  Thank you for coming in today.    Back Pain, Adult Many adults have back pain from time to time. Common causes of back pain include:  A strained muscle or ligament.  Wear and tear (degeneration) of the spinal disks.  Arthritis.  A hit to the back.  Back pain can be short-lived (acute) or last a long time (chronic). A physical exam, lab tests, and imaging studies may be done to find the cause of your pain. Follow these instructions at home: Managing pain and stiffness  Take over-the-counter and prescription medicines only as told by your health care provider.  If directed, apply heat to the affected area as often as told by your health care provider. Use the heat source that your health care provider recommends, such as a moist heat pack or a heating pad. ? Place a towel between your skin and the heat source. ? Leave the heat on for 20-30 minutes. ? Remove the heat if your skin turns bright red. This is especially important if you are unable to feel pain, heat, or cold. You have a greater risk of getting burned.  If directed, apply ice to the injured area: ? Put ice in a plastic bag. ? Place a towel between your skin and the bag. ? Leave the  ice on for 20 minutes, 2-3 times a day for the first 2-3 days. Activity  Do not stay in bed. Resting more than 1-2 days can delay your recovery.  Take short walks on even surfaces as soon as you are able. Try to increase the length of time you walk each day.  Do not sit, drive, or stand in one place for more than 30 minutes at a time. Sitting or standing for long periods of time can put stress on your back.  Use proper lifting techniques. When you bend and lift, use positions that put less stress on your back: ? [place] your knees. ? Keep the load close to your body. ? Avoid twisting.  Exercise regularly as told by your health care provider. Exercising will help your back heal faster. This also helps prevent back injuries by keeping muscles strong and flexible.  Your health care provider may recommend that you see a physical therapist. This person can help you come up with a safe exercise program. Do any exercises as told by your physical therapist. Lifestyle  Maintain a healthy weight. Extra weight puts stress on your back and makes it difficult to have good posture.  Avoid activities or situations that make you feel anxious or stressed. Learn ways to manage anxiety and stress. One way to manage stress is through exercise. Stress and anxiety increase muscle tension and can make back pain worse.  General instructions  Sleep on a firm mattress in a comfortable position. Try lying on your side with your knees slightly bent. If you lie on your back, put a pillow under your knees.  Follow your treatment plan as told by your health care provider. This may include: ? Cognitive or behavioral therapy. ? Acupuncture or massage therapy. ? Meditation or yoga. Contact a health care provider if:  You have pain that is not relieved with rest or medicine.  You have increasing pain going down into your legs or buttocks.  Your pain does not improve in 2 weeks.  You have pain at night.  You lose  weight.  You have a fever or chills. Get help right away if:  You develop new bowel or bladder control problems.  You have unusual weakness or numbness in your arms or legs.  You develop nausea or vomiting.  You develop abdominal pain.  You feel faint. Summary  Many adults have back pain from time to time. A physical exam, lab tests, and imaging studies may be done to find the cause of your pain.  Use proper lifting techniques. When you bend and lift, use positions that put less stress on your back.  Take over-the-counter and prescription medicines and apply heat or ice as directed by your health care provider. This information is not intended to replace advice given to you by your health care provider. Make sure you discuss any questions you have with your health care provider. Document Released: 01/19/2005 Document Revised: 02/24/2016 Document Reviewed: 02/24/2016 Elsevier Interactive Patient Education  2018 [employer]  Cervical Radiculopathy Cervical radiculopathy happens when a nerve in the neck (cervical nerve) is pinched or bruised. This condition can develop because of an injury or as part of the normal aging process. Pressure on the cervical nerves can cause pain or numbness that runs from the neck all the way down into the arm and fingers. Usually, this condition gets better with rest. Treatment may be needed if the condition does not improve. What are the causes? This condition may be caused by:  Injury.  Slipped (herniated) disk.  Muscle tightness in the neck because of overuse.  Arthritis.  Breakdown or degeneration in the bones and joints of the spine (spondylosis) due to aging.  Bone spurs that may develop near the cervical nerves.  What are the signs or symptoms? Symptoms of this condition include:  Pain that runs from the neck to the arm and hand. The pain can be severe or irritating. It may be worse when the neck is moved.  Numbness or weakness in  the affected arm and hand.  How is this diagnosed? This condition may be diagnosed based on symptoms, medical history, and a physical exam. You may also have tests, including:  X-rays.  CT scan.  MRI.  Electromyogram (EMG).  Nerve conduction tests.  How is this treated? In many cases, treatment is not needed for this condition. With rest, the condition usually gets better over time. If treatment is needed, options may include:  Wearing a soft neck collar for short periods of time.  Physical therapy to strengthen your neck muscles.  Medicines, such as NSAIDs, oral corticosteroids, or spinal injections.  Surgery. This may be needed if other treatments do not help. Various types of surgery may be done depending on the cause of your problems.  Follow these instructions at home: Managing pain  Take over-the-counter and prescription medicines only as told by your health care provider.  If directed, apply ice to the affected area. ? Put ice in a plastic bag. ? Place a towel between your skin and the bag. ? Leave the ice on for 20 minutes, 2-3 times per day.  If ice does not help, you can try using heat. Take a warm shower or warm bath, or use a heat pack as told by your health care provider.  Try a gentle neck and shoulder massage to help relieve symptoms. Activity  Rest as needed. Follow instructions from your health care provider about any restrictions on activities.  Do stretching and strengthening exercises as told by your health care provider or physical therapist. General instructions  If you were given a soft collar, wear it as told by your health care provider.  Use a flat pillow when you sleep.  Keep all follow-up visits as told by your health care provider. This is important. Contact a health care provider if:  Your condition does not improve with treatment. Get help right away if:  Your pain gets much worse and cannot be controlled with medicines.  You  have weakness or numbness in your hand, arm, face, or leg.  You have a high fever.  You have a stiff, rigid neck.  You lose control of your bowels or your bladder (have incontinence).  You have trouble with walking, balance, or speaking. This information is not intended to replace advice given to you by your health care provider. Make sure you discuss any questions you have with your health care provider. Document Released: 10/14/2000 Document Revised: 06/27/2015 Document Reviewed: 03/15/2014 Elsevier Interactive Patient Education  2018 [employer].     IF you received an x-ray today, you will receive an invoice from [facility]. Please contact [facility] at [phone] with questions or concerns regarding your invoice.   IF you received labwork today, you will receive an invoice from [place]. Please contact [facility] at [phone] with questions or concerns regarding your invoice.   Our billing staff will not be able to assist you with questions regarding bills from these companies.  You will be contacted with the lab results as soon as they are available. The fastest way to get your results is to activate your My Chart account. Instructions are located on the last page of this paperwork. If you have not heard from [place] regarding the results in 2 weeks, please contact this office.

## 2017-09-16 NOTE — Progress Notes (Signed)
Subjective:  By signing my name below, I, Robin Delacruz, attest that this documentation has been prepared under the direction and in the presence of [name], MD Electronically Signed: Charline Delacruz, ED Scribe 09/16/2017 at 10:51 AM.   Patient ID: Robin Delacruz, female    DOB: 10-28-1965, 52 y.o.   MRN: [id number]  Chief Complaint  Patient presents with  . Neck and lower back  Pain    pinching on nearve and tingling on left arm   HPI Robin Delacruz is a 52 y.o. female who presents to Primary Care at [facility] complaining of neck and back pain. Last seen 8/1 for unrelated issues but did note some neck symptoms, poss muscle spasms at that time.  Today, pt reports intermittent pinching sensation in the L neck with L arm numbness and coolness in her L hand. 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 Also reports that she wakes with stiffness in the mornings, the area loosens as she starts her day but flares up again at work and with palpation. She works 2 days, has off 2 days, works 3 days. Pt also reports some subjective L arm weakness. She has tried 800 mg ibuprofen 4-5 times/wk at work, Tylenol qd, heating pad nightly, stretching and exercise with temporary relief. Denies fall/injury, prev surgeries. Pt had reportedly had a MRI in [place], recommended poss surgery for shoulder in 2006.  Low Back Pain Pt also reports low back pain x 1-2 months that she describes as tension. She suspects that pain is due to standing for 12 hrs in boots for work. Denies numbness/tingling in LE, weakness in LE, bladder/bowel incontinence, saddle anesthesia.  Patient Active Problem List   Diagnosis Date Noted  . Type 2 diabetes mellitus without complication (HCC) 03/05/2014  . Ovarian cyst   . Anemia   . Dyspnea   . Fatigue   . Chest pain 11/02/2011  . Hypertension    Past Medical History:    Diagnosis Date  . Allergy    seasonal  . Anemia   . Chest pain   . Diabetes mellitus    Diagnosed at age 18  . Dyspnea   . Fatigue   . Hypertension   . Ovarian cyst    Past Surgical History:  Procedure Laterality Date  . ABLATION    . CESAREAN SECTION    . TONSILLECTOMY    . TUBAL LIGATION     Allergies  Allergen Reactions  . Artichoke [Cynara Scolymus (Artichoke)] Hives  . Codeine Anaphylaxis and Hives  . Penicillins Anaphylaxis and Hives  . Sulfa Antibiotics Anaphylaxis and Hives   Prior to Admission medications   Medication Sig Start Date End Date Taking? Authorizing Provider  albuterol (PROVENTIL HFA;VENTOLIN HFA) 108 (90 Base) MCG/ACT inhaler Inhale 1-2 puffs into the lungs every 4 (four) hours as needed for wheezing or shortness of breath (cough, shortness of breath or wheezing.). 06/05/16   [name], MD  amLODipine (NORVASC) 5 MG tablet Take 1 tablet (5 mg total) by mouth daily. 06/01/17   [name], MD  aspirin EC 81 MG tablet Take 1 tablet (81 mg total) by mouth daily. 11/02/11   [name], MD  atorvastatin (LIPITOR) 20 MG tablet Take 1 tablet (20 mg total) by mouth daily. 06/01/17   [name], MD  benazepril (LOTENSIN) 40 MG tablet Take 1 tablet (40 mg  total) by mouth daily. 06/01/17   [name], MD  ferrous sulfate 325 (65 FE) MG tablet Take 1 tablet (325 mg total) by mouth daily with breakfast. 10/29/11   [name], MD  metFORMIN (GLUCOPHAGE) 500 MG tablet Take 1 tablet (500 mg total) by mouth 2 (two) times daily with a meal. 06/01/17   [name], MD   Social History   Socioeconomic History  . Marital status: Divorced    Spouse name: Not on file  . Number of children: 2  . Years of education: Not on file  . Highest education level: Not on file  Occupational History  . 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 Financial resource strain: Not on file  . Food insecurity:     Worry: Not on file    Inability: Not on file  . Transportation needs:    Medical: Not on file    Non-medical: Not on file  Tobacco Use  . Smoking status: Never Smoker  . Smokeless tobacco: Never Used  Substance and Sexual Activity  . Alcohol use: No    Alcohol/week: 0.0 standard drinks  . Drug use: No  . Sexual activity: Never  Lifestyle  . Physical activity:    Days per week: Not on file    Minutes per session: Not on file  . Stress: Not on file  Relationships  . Social connections:    Talks on phone: Not on file    Gets together: Not on file    Attends religious service: Not on file    Active member of club or organization: Not on file    Attends meetings of clubs or organizations: Not on file    Relationship status: Not on file  . Intimate partner violence:    Fear of current or ex partner: Not on file    Emotionally abused: Not on file    Physically abused: Not on file    Forced sexual activity: Not on file  Other Topics Concern  . Not on file  Social History Narrative  . Not on file   Review of Systems  Musculoskeletal: Positive for back pain, neck pain and neck stiffness (intermittent).  Neurological: Positive for weakness (subjective, L arm) and numbness (L arm).      Objective:   Physical Exam  Constitutional: She is oriented to person, place, and time. She appears well-developed and well-nourished. No distress.  HENT:  Head: Normocephalic and atraumatic.  Eyes: Conjunctivae and EOM are normal.  Neck: Neck supple. No tracheal deviation present.  Cardiovascular: Normal rate.  Pulses:      Radial pulses are 2+ on the left side.  Pulmonary/Chest: Effort normal. No respiratory distress.  Musculoskeletal:  C-spine: No midline bony tenderness. Spasms into L paraspinals, primarily L trapezius into L rhomboids. Scapula nontender. Full flexion. Lacking ~20-30 degrees of extension. Lacking 15-20 degrees of R rotation. Lacking ~20-30 degrees L rotation. Pain with R  lateral greater than L lateral flexion, slight decreased lateral flexion bilaterally. L shoulder: FROM. [place], clavicle, AC nontender. Full RTC strength. Cap refill less than 1 sec at finger tips. Back: Slight tenderness along paraspinals. No midline bony tenderness. Neg seated straight leg raise. Able to heel and toe walk without difficulty.  Neurological: She is alert and oriented to person, place, and time.  Reflex Scores:      Tricep reflexes are 2+ on the right side and 2+ on the left side.  Bicep reflexes are 2+ on the right side and 2+ on the left side.      Brachioradialis reflexes are 2+ on the right side and 2+ on the left side. Equal bicep, tricep, grip strength.  Skin: Skin is warm and dry.  Psychiatric: She has a normal mood and affect. Her behavior is normal.  Nursing note and vitals reviewed.  Vitals:   09/16/17 1046 09/16/17 1050  BP: (!) 143/78 132/78  Pulse: 79   Temp: 98.4 F (36.9 C)   TempSrc: Oral   SpO2: 94%   Weight: 196 lb 6.4 oz (89.1 kg)   Height: 5' 6.5" (1.689 m)   Dg Cervical Spine Complete  Result Date: 09/16/2017 CLINICAL DATA:  52 year old female with left side neck pain and radiculopathy. EXAM: CERVICAL SPINE - COMPLETE 4+ VIEW COMPARISON:  Chest radiographs 10/27/2011. FINDINGS: Straightening of cervical lordosis. Normal prevertebral soft tissue contour. Bilateral posterior element alignment is within normal limits. Cervicothoracic junction alignment is within normal limits. C1-C2 alignment and joint spaces are preserved. AP alignment within normal limits. Moderate to severe disc space loss C4-C5 through C6-C7. Endplate spurring, most pronounced at the latter. No significant facet hypertrophy identified. Evidence of bilateral C6 and C7 neural foraminal stenosis. Submandibular region 6-7 millimeters sialolith, favored to beyond the left. Other visible neck soft tissues appear negative. Stable and negative visible upper chest. IMPRESSION: 1. Advanced lower  cervical disc and endplate degeneration C4-C5 through C6-C7 with evidence of degenerative C6 and C7 neural foraminal stenosis. 2. Submandibular gland 6-7 mm Sialolith suspected, probably on the left. Query left submandibular region neck pain. 3.  No acute osseous abnormality identified. Electronically Signed   By: Odessa Fleming M.D.   On: 09/16/2017 11:29       Assessment & Plan:    Yarithza Mink is a 52 y.o. female Left cervical radiculopathy - Plan: DG Cervical Spine Complete, cyclobenzaprine (FLEXERIL) 5 MG tablet, Ambulatory referral to Physical Therapy Neck pain - Plan: DG Cervical Spine Complete, cyclobenzaprine (FLEXERIL) 5 MG tablet, Ambulatory referral to Physical Therapy  -Significant cervical disc disease, suspected cervical radiculopathy, possibly worsened with spasm.  As intermittent symptoms, option of initial trial of physical therapy, anti-inflammatory, muscle relaxant as needed with side effects/risk discussed.  -Consider MRI or orthopedic evaluation if not improving, RTC precautions if worse  X-ray noted possible sialolith, not specifically complaining of discomfort in that area but will send her message with results and can discuss further in office.    Bilateral low back pain without sciatica, unspecified chronicity - Plan: cyclobenzaprine (FLEXERIL) 5 MG tablet, Ambulatory referral to Physical Therapy  -Lumbago, probable mechanical low back pain.  PT referral, ibuprofen, Flexeril as above, RTC precautions  Meds ordered this encounter  Medications  . cyclobenzaprine (FLEXERIL) 5 MG tablet    Sig: Take 1 tablet (5 mg total) by mouth 3 (three) times daily as needed for muscle spasms (start qhs prn due to sedation).    Dispense:  15 tablet    Refill:  1   Patient Instructions   Neck and arm symptoms are likely due to spasm and pinched nerve, causing cervical radiculopathy.  See information below on this condition.  As symptoms are intermittent, it may be worth trying  physical therapy initially.  Additionally can use ibuprofen short-term as needed with food and muscle relaxant was prescribed as needed.  Start that at bedtime due to sedation.    If your symptoms are worsening, we could consider a course of prednisone, MRI, or  orthopedic referral.  Return to the clinic or go to the nearest emergency room if any of your symptoms worsen or new symptoms occur.  See other information below on low back pain.  Physical therapy, stretches may also help with that condition as well as the muscle relaxant.  Recheck in 3 to 4 weeks.  Thank you for coming in today.    Back Pain, Adult Many adults have back pain from time to time. Common causes of back pain include:  A strained muscle or ligament.  Wear and tear (degeneration) of the spinal disks.  Arthritis.  A hit to the back.  Back pain can be short-lived (acute) or last a long time (chronic). A physical exam, lab tests, and imaging studies may be done to find the cause of your pain. Follow these instructions at home: Managing pain and stiffness  Take over-the-counter and prescription medicines only as told by your health care provider.  If directed, apply heat to the affected area as often as told by your health care provider. Use the heat source that your health care provider recommends, such as a moist heat pack or a heating pad. ? Place a towel between your skin and the heat source. ? Leave the heat on for 20-30 minutes. ? Remove the heat if your skin turns bright red. This is especially important if you are unable to feel pain, heat, or cold. You have a greater risk of getting burned.  If directed, apply ice to the injured area: ? Put ice in a plastic bag. ? Place a towel between your skin and the bag. ? Leave the ice on for 20 minutes, 2-3 times a day for the first 2-3 days. Activity  Do not stay in bed. Resting more than 1-2 days can delay your recovery.  Take short walks on even surfaces as soon  as you are able. Try to increase the length of time you walk each day.  Do not sit, drive, or stand in one place for more than 30 minutes at a time. Sitting or standing for long periods of time can put stress on your back.  Use proper lifting techniques. When you bend and lift, use positions that put less stress on your back: ? Patrick AFBBend your knees. ? Keep the load close to your body. ? Avoid twisting.  Exercise regularly as told by your health care provider. Exercising will help your back heal faster. This also helps prevent back injuries by keeping muscles strong and flexible.  Your health care provider may recommend that you see a physical therapist. This person can help you come up with a safe exercise program. Do any exercises as told by your physical therapist. Lifestyle  Maintain a healthy weight. Extra weight puts stress on your back and makes it difficult to have good posture.  Avoid activities or situations that make you feel anxious or stressed. Learn ways to manage anxiety and stress. One way to manage stress is through exercise. Stress and anxiety increase muscle tension and can make back pain worse. General instructions  Sleep on a firm mattress in a comfortable position. Try lying on your side with your knees slightly bent. If you lie on your back, put a pillow under your knees.  Follow your treatment plan as told by your health care provider. This may include: ? Cognitive or behavioral therapy. ? Acupuncture or massage therapy. ? Meditation or yoga. Contact a health care provider if:  You have pain that is not relieved  with rest or medicine.  You have increasing pain going down into your legs or buttocks.  Your pain does not improve in 2 weeks.  You have pain at night.  You lose weight.  You have a fever or chills. Get help right away if:  You develop new bowel or bladder control problems.  You have unusual weakness or numbness in your arms or legs.  You develop  nausea or vomiting.  You develop abdominal pain.  You feel faint. Summary  Many adults have back pain from time to time. A physical exam, lab tests, and imaging studies may be done to find the cause of your pain.  Use proper lifting techniques. When you bend and lift, use positions that put less stress on your back.  Take over-the-counter and prescription medicines and apply heat or ice as directed by your health care provider. This information is not intended to replace advice given to you by your health care provider. Make sure you discuss any questions you have with your health care provider. Document Released: 01/19/2005 Document Revised: 02/24/2016 Document Reviewed: 02/24/2016 Elsevier Interactive Patient Education  2018 [employer]  Cervical Radiculopathy Cervical radiculopathy happens when a nerve in the neck (cervical nerve) is pinched or bruised. This condition can develop because of an injury or as part of the normal aging process. Pressure on the cervical nerves can cause pain or numbness that runs from the neck all the way down into the arm and fingers. Usually, this condition gets better with rest. Treatment may be needed if the condition does not improve. What are the causes? This condition may be caused by:  Injury.  Slipped (herniated) disk.  Muscle tightness in the neck because of overuse.  Arthritis.  Breakdown or degeneration in the bones and joints of the spine (spondylosis) due to aging.  Bone spurs that may develop near the cervical nerves.  What are the signs or symptoms? Symptoms of this condition include:  Pain that runs from the neck to the arm and hand. The pain can be severe or irritating. It may be worse when the neck is moved.  Numbness or weakness in the affected arm and hand.  How is this diagnosed? This condition may be diagnosed based on symptoms, medical history, and a physical exam. You may also have tests, including:  X-rays.  CT  scan.  MRI.  Electromyogram (EMG).  Nerve conduction tests.  How is this treated? In many cases, treatment is not needed for this condition. With rest, the condition usually gets better over time. If treatment is needed, options may include:  Wearing a soft neck collar for short periods of time.  Physical therapy to strengthen your neck muscles.  Medicines, such as NSAIDs, oral corticosteroids, or spinal injections.  Surgery. This may be needed if other treatments do not help. Various types of surgery may be done depending on the cause of your problems.  Follow these instructions at home: Managing pain  Take over-the-counter and prescription medicines only as told by your health care provider.  If directed, apply ice to the affected area. ? Put ice in a plastic bag. ? Place a towel between your skin and the bag. ? Leave the ice on for 20 minutes, 2-3 times per day.  If ice does not help, you can try using heat. Take a warm shower or warm bath, or use a heat pack as told by your health care provider.  Try a gentle neck and shoulder massage to help  relieve symptoms. Activity  Rest as needed. Follow instructions from your health care provider about any restrictions on activities.  Do stretching and strengthening exercises as told by your health care provider or physical therapist. General instructions  If you were given a soft collar, wear it as told by your health care provider.  Use a flat pillow when you sleep.  Keep all follow-up visits as told by your health care provider. This is important. Contact a health care provider if:  Your condition does not improve with treatment. Get help right away if:  Your pain gets much worse and cannot be controlled with medicines.  You have weakness or numbness in your hand, arm, face, or leg.  You have a high fever.  You have a stiff, rigid neck.  You lose control of your bowels or your bladder (have incontinence).  You  have trouble with walking, balance, or speaking. This information is not intended to replace advice given to you by your health care provider. Make sure you discuss any questions you have with your health care provider. Document Released: 10/14/2000 Document Revised: 06/27/2015 Document Reviewed: 03/15/2014 Elsevier Interactive Patient Education  2018 [employer].     IF you received an x-ray today, you will receive an invoice from [facility]. Please contact [facility] at [phone] with questions or concerns regarding your invoice.   IF you received labwork today, you will receive an invoice from [facility]. Please contact [facility] at [phone] with questions or concerns regarding your invoice.   Our billing staff will not be able to assist you with questions regarding Delacruz from these companies.  You will be contacted with the lab results as soon as they are available. The fastest way to get your results is to activate your My Chart account. Instructions are located on the last page of this paperwork. If you have not heard from [place] regarding the results in 2 weeks, please contact this office.       I personally performed the services described in this documentation, which was scribed in my presence. The recorded information has been reviewed and considered for accuracy and completeness, addended by me as needed, and agree with information above.  Signed,   Meredith Staggers, MD Primary Care at [facility].  09/22/17 2:16 PM

## 2017-09-22 ENCOUNTER — Encounter: Payer: Self-pay | Admitting: Family Medicine

## 2017-09-30 ENCOUNTER — Ambulatory Visit: Payer: 59 | Admitting: Physical Therapy

## 2017-10-13 ENCOUNTER — Other Ambulatory Visit: Payer: Self-pay

## 2017-10-13 ENCOUNTER — Ambulatory Visit: Payer: 59 | Attending: Family Medicine | Admitting: Physical Therapy

## 2017-10-13 ENCOUNTER — Encounter: Payer: Self-pay | Admitting: Physical Therapy

## 2017-10-13 DIAGNOSIS — M542 Cervicalgia: Secondary | ICD-10-CM | POA: Insufficient documentation

## 2017-10-13 DIAGNOSIS — M545 Low back pain, unspecified: Secondary | ICD-10-CM

## 2017-10-13 DIAGNOSIS — R252 Cramp and spasm: Secondary | ICD-10-CM | POA: Diagnosis present

## 2017-10-13 NOTE — Therapy (Signed)
[facility] [place] [address] [place], [place], [address] Phone: [phone]   Fax:  [phone]  Physical Therapy Evaluation  Patient Details  Name: Robin Delacruz MRN: [id number] Date of Birth: 1965-04-09 Referring Provider: Dorna Bloom   Encounter Date: 10/13/2017  PT End of Session - 10/13/17 0828    Visit Number  1    Date for PT Re-Evaluation  12/13/17    PT Start Time  0757    PT Stop Time  0850    PT Time Calculation (min)  53 min    Activity Tolerance  Patient tolerated treatment well    Behavior During Therapy  [facility] for tasks assessed/performed       Past Medical History:  Diagnosis Date  . Allergy    seasonal  . Anemia   . Chest pain   . Diabetes mellitus    Diagnosed at age 25  . Dyspnea   . Fatigue   . Hypertension   . Ovarian cyst     Past Surgical History:  Procedure Laterality Date  . ABLATION    . CESAREAN SECTION    . TONSILLECTOMY    . TUBAL LIGATION      There were no vitals filed for this visit.   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  Reports recently by the end of the day she is really hurting.  X-ray of the cervical spine shows DDD and arthritic changes.    Limitations  Standing;Lifting    Patient Stated Goals  have less pain, feel better    Currently in Pain?  Yes    Pain Score  2     Pain Location  Neck   some pain in the mid and low back   Pain Orientation  Left    Pain Descriptors / Indicators  Aching    Pain Type  Acute pain    Pain Radiating Towards  she reports pain and tingling down the left arm, to the middle finger    Pain Onset  More than a month ago    Pain  Frequency  Constant    Aggravating Factors   open heavy doors, standing and walking all day pain up to 8/10, when she has to do more restraints she is in increased pain at times up to 9/10    Pain Relieving Factors  heat, rest, mm relaxer pain at best a 2/10    Effect of Pain on Daily Activities  diffiuclty with ADL's and work         [facility] PT Assessment - 10/13/17 0001      Assessment   Medical Diagnosis  neck pain, back pain    Referring Provider  Dorna Bloom    Onset Date/Surgical Date  09/12/17    Hand Dominance  Right    Prior Therapy  no      Precautions   Precautions  None      Balance Screen   Has the patient fallen in the past 6 months  No    Has the patient had a decrease in activity level because of a fear of falling?  No    Is the patient reluctant to leave their home because of a fear of falling?   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  Lumbar ROM WFL's      Strength   Overall Strength Comments  Hips 4+/5, shoulder flexion and abduction 4-/5 with pain in the left neck and upper trap area, ER/IR WFL's      Flexibility   Soft Tissue Assessment /Muscle Length  --   good LE flexibility     Palpation   Palpation comment  significant tightness iwth knots in the lfet upper trap, rhomboid and along the paraspinals cervical to lumbar       Special Tests   Other special tests  some neural tension in the left UE                 Objective measurements completed on examination: See above findings.      [facility] Adult PT Treatment/Exercise - 10/13/17 0001      Modalities   Modalities  Electrical Stimulation;Moist Heat      Moist Heat Therapy   Number Minutes Moist Heat  15 Minutes    Moist Heat Location  Cervical;Lumbar Spine      Electrical Stimulation   Electrical Stimulation Location  left upper trap and into the rhomboid    Electrical Stimulation Action  IFC    Electrical Stimulation Parameters  supine    Electrical Stimulation Goals  Pain             PT Education - 10/13/17 0827    Education Details  cervical and scapular retraction, shoulder shrugs and neural tension stretches    Person(s) Educated  Patient    Methods  Explanation;Demonstration;Handout    Comprehension  Verbalized understanding       PT Short Term Goals - 10/13/17 0831      PT SHORT TERM GOAL #1   Title  independent with initial HEP    Time  2    Period  Weeks        PT Long Term Goals - 10/13/17 2641      PT LONG TERM GOAL #1   Title  understand proper posture and body mechanics    Time  8    Period  Weeks    Status  New      PT LONG TERM GOAL #2   Title  indepednent and safe with gym exercises    Time  8    Period  Weeks    Status  New      PT LONG TERM GOAL #3   Title  decreas pain 50%    Time  8    Period  Weeks    Status  New      PT LONG TERM GOAL #4  Title  increase cerviccal ROM 25%    Time  8    Period  Weeks    Status  New             Plan - 10/13/17 2440    Clinical Impression Statement  PAteint reports some neck and back pain over the years, reports recently she has had increase left arm and neck pain, x-rays show DDD and some arthritic changes, 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  Has limited cervical ROM, + left UE neural tension, significant spasms and trigger points in the upper trap and rhomboid on the left     Clinical Presentation  Stable    Clinical Decision Making  Low    Rehab Potential  Good    PT Frequency  2x / week    PT Duration  8 weeks    PT Treatment/Interventions  ADLs/Self Care Home Management;Iontophoresis 4mg /ml Dexamethasone;Cryotherapy;Electrical Stimulation;Ultrasound;Traction;Moist Heat;Therapeutic activities;Therapeutic exercise;Manual techniques;Patient/family education;Dry needling    PT Next Visit Plan  start gym, could try traction, STM or DN    Consulted and Agree with Plan of Care  Patient       Patient will benefit from skilled therapeutic intervention in order to improve the following deficits and impairments:  Decreased range of motion, Difficulty walking, Increased muscle spasms, Increased fascial restricitons, Pain, Improper body mechanics, Impaired flexibility, Decreased mobility, Decreased strength, Postural dysfunction  Visit Diagnosis: Cervicalgia - Plan: PT plan of care cert/re-cert  Cramp and spasm - Plan: PT plan of care cert/re-cert  Acute bilateral low back pain without sciatica - Plan: PT plan of care cert/re-cert     Problem List Patient Active Problem List   Diagnosis Date Noted  . Type 2 diabetes mellitus without complication (HCC) 03/05/2014  . Ovarian cyst   . Anemia   . Dyspnea   . Fatigue   . Chest pain 11/02/2011  . Hypertension     Jearld Lesch., PT 10/13/2017, 8:35 AM  [facility] [place] [address] [place], [place], [address] Phone: [phone]   Fax:  [phone]  Name: Robin Delacruz MRN: [id number] Date of Birth: Jul 23, 1965

## 2017-10-14 ENCOUNTER — Encounter: Payer: Self-pay | Admitting: Family Medicine

## 2017-10-14 ENCOUNTER — Ambulatory Visit: Payer: 59 | Admitting: Family Medicine

## 2017-10-14 VITALS — BP 137/76 | HR 71 | Temp 98.2°F | Ht 66.0 in | Wt 191.2 lb

## 2017-10-14 DIAGNOSIS — Z23 Encounter for immunization: Secondary | ICD-10-CM | POA: Diagnosis not present

## 2017-10-14 DIAGNOSIS — M545 Low back pain: Secondary | ICD-10-CM

## 2017-10-14 DIAGNOSIS — M5412 Radiculopathy, cervical region: Secondary | ICD-10-CM

## 2017-10-14 DIAGNOSIS — K115 Sialolithiasis: Secondary | ICD-10-CM

## 2017-10-14 DIAGNOSIS — M26629 Arthralgia of temporomandibular joint, unspecified side: Secondary | ICD-10-CM | POA: Diagnosis not present

## 2017-10-14 NOTE — Patient Instructions (Addendum)
Continue physical therapy, okay to continue Flexeril as needed.  Let me know if you do need a refill of that medication.  Follow-up in 6 weeks and we can decide at that point further imaging or meeting with orthopedics is needed.  I am happy to see you sooner if needed.  See information below on TMJ syndrome.  Episodic use of ibuprofen may be helpful, can also discuss other treatment with your dentist.  Muscle relaxant additionally can help if needed.   I will refer you to ear nose and throat for the possible sialolith.  Return to the clinic or go to the nearest emergency room if any of your symptoms worsen or new symptoms occur.   Temporomandibular Joint Syndrome Temporomandibular joint (TMJ) syndrome is a condition that affects the joints between your jaw and your skull. The TMJs are located near your ears and allow your jaw to open and close. These joints and the nearby muscles are involved in all movements of the jaw. People with TMJ syndrome have pain in the area of these joints and muscles. Chewing, biting, or other movements of the jaw can be difficult or painful. TMJ syndrome can be caused by various things. In many cases, the condition is mild and goes away within a few weeks. For some people, the condition can become a long-term problem. What are the causes? Possible causes of TMJ syndrome include:  Grinding your teeth or clenching your jaw. Some people do this when they are under stress.  Arthritis.  Injury to the jaw.  Head or neck injury.  Teeth or dentures that are not aligned well.  In some cases, the cause of TMJ syndrome may not be known. What are the signs or symptoms? The most common symptom is an aching pain on the side of the head in the area of the TMJ. Other symptoms may include:  Pain when moving your jaw, such as when chewing or biting.  Being unable to open your jaw all the way.  Making a clicking sound when you open your  mouth.  Headache.  Earache.  Neck or shoulder pain.  How is this diagnosed? Diagnosis can usually be made based on your symptoms, your medical history, and a physical exam. Your health care provider may check the range of motion of your jaw. Imaging tests, such as X-rays or an MRI, are sometimes done. You may need to see your dentist to determine if your teeth and jaw are lined up correctly. How is this treated? TMJ syndrome often goes away on its own. If treatment is needed, the options may include:  Eating soft foods and applying ice or heat.  Medicines to relieve pain or inflammation.  Medicines to relax the muscles.  A splint, bite plate, or mouthpiece to prevent teeth grinding or jaw clenching.  Relaxation techniques or counseling to help reduce stress.  Transcutaneous electrical nerve stimulation (TENS). This helps to relieve pain by applying an electrical current through the skin.  Acupuncture. This is sometimes helpful to relieve pain.  Jaw surgery. This is rarely needed.  Follow these instructions at home:  Take medicines only as directed by your health care provider.  Eat a soft diet if you are having trouble chewing.  Apply ice to the painful area. ? Put ice in a plastic bag. ? Place a towel between your skin and the bag. ? Leave the ice on for 20 minutes, 2-3 times a day.  Apply a warm compress to the painful area as  directed.  Massage your jaw area and perform any jaw stretching exercises as recommended by your health care provider.  If you were given a mouthpiece or bite plate, wear it as directed.  Avoid foods that require a lot of chewing. Do not chew gum.  Keep all follow-up visits as directed by your health care provider. This is important. Contact a health care provider if:  You are having trouble eating.  You have new or worsening symptoms. Get help right away if:  Your jaw locks open or closed. This information is not intended to replace  advice given to you by your health care provider. Make sure you discuss any questions you have with your health care provider. Document Released: 10/14/2000 Document Revised: 09/19/2015 Document Reviewed: 08/24/2013 Elsevier Interactive Patient Education  [employer]2018 [employer]   If you have lab work done today you will be contacted with your lab results within the next 2 weeks.  If you have not heard from us then please contact us. The fastest way to get your results is to register for My Chart.   IF you received an x-ray today, you will receive an invoice from [facility]. Please contact [facility] at [phone] with questions or concerns regarding your invoice.   IF you received labwork today, you will receive an invoice from [facility]. Please contact [facility] at [phone] with questions or concerns regarding your invoice.   Our billing staff will not be able to assist you with questions regarding bills from these companies.  You will be contacted with the lab results as soon as they are available. The fastest way to get your results is to activate your My Chart account. Instructions are located on the last page of this paperwork. If you have not heard from us regarding the results in 2 weeks, please contact this office.

## 2017-10-14 NOTE — Progress Notes (Signed)
Subjective:  By signing my name below, I, [name], attest that this documentation has been prepared under the direction and in the presence of Shade FloodJeffrey R Ralph Brouwer, MD Electronically Signed: Charline BillsEssence Howell, ED Scribe 10/14/2017 at 11:14 AM.   Patient ID: Robin MareElizabeth Delacruz, female    DOB: 1965/02/04, 52 y.o.   MRN: [id number]  Chief Complaint  Patient presents with  . left shoulder    f/u   . lab results   HPI Robin Marelizabeth Sebesta is a 52 y.o. female who presents to [facility] Care at [facility] for f/u. Seen 8/15 for L cervical radiculopathy for persistent neck and back pain. Back pain x 1-2 months, neck pain intermittent for yrs, worsened over prev 1 yr. Due to intermittent symptoms, referred to PT, episodic ibuprofen short term, flexeril prn. - Pt reports that she still has some neck stiffness in the mornings that radiate into the L shoulder. She had her first PT session yesterday which she states temporarily improved pain/loosened up her neck afterwards, especially with the tens unit. She has not taken ibuprofen since she has been off of work for the last 3 days. Pt has taken flexeril prn, last dose was last night after PT. Also reports that heat was applied to her low back yesterday during PT.  Poss Sialolith Noted on imaging of c-spine from last OV. Probable L submandibular gland 6-7 mm. - Pt has noticed some thickness/fullness in the L neck but denies pain. Also reports some jaw pain in the TMJ area which she attributes to grinding her teeth at night for sev yrs.  Patient Active Problem List   Diagnosis Date Noted  . Type 2 diabetes mellitus without complication (HCC) 03/05/2014  . Ovarian cyst   . Anemia   . Dyspnea   . Fatigue   . Chest pain 11/02/2011  . Hypertension    Past Medical History:  Diagnosis Date  . Allergy    seasonal  . Anemia   . Chest pain   . Diabetes mellitus    Diagnosed at age 52  . Dyspnea   . Fatigue   . Hypertension   . Ovarian cyst    Past Surgical  History:  Procedure Laterality Date  . ABLATION    . CESAREAN SECTION    . TONSILLECTOMY    . TUBAL LIGATION     Allergies  Allergen Reactions  . Artichoke [Cynara Scolymus (Artichoke)] Hives  . Codeine Anaphylaxis and Hives  . Penicillins Anaphylaxis and Hives  . Sulfa Antibiotics Anaphylaxis and Hives   Prior to Admission medications   Medication Sig Start Date End Date Taking? Authorizing Provider  albuterol (PROVENTIL HFA;VENTOLIN HFA) 108 (90 Base) MCG/ACT inhaler Inhale 1-2 puffs into the lungs every 4 (four) hours as needed for wheezing or shortness of breath (cough, shortness of breath or wheezing.). 06/05/16  Yes Shade FloodGreene, Izabell Schalk R, MD  amLODipine (NORVASC) 5 MG tablet Take 1 tablet (5 mg total) by mouth daily. 06/01/17  Yes Shade FloodGreene, Seichi Kaufhold R, MD  aspirin EC 81 MG tablet Take 1 tablet (81 mg total) by mouth daily. 11/02/11  Yes Lewayne Buntingrenshaw, Brian S, MD  atorvastatin (LIPITOR) 20 MG tablet Take 1 tablet (20 mg total) by mouth daily. 06/01/17  Yes Shade FloodGreene, Kenzli Barritt R, MD  benazepril (LOTENSIN) 40 MG tablet Take 1 tablet (40 mg total) by mouth daily. 06/01/17  Yes Shade FloodGreene, Andruw Battie R, MD  cyclobenzaprine (FLEXERIL) 5 MG tablet Take 1 tablet (5 mg total) by mouth 3 (three) times daily as needed for  muscle spasms (start qhs prn due to sedation). 09/16/17  Yes [name], MD  ferrous sulfate 325 (65 FE) MG tablet Take 1 tablet (325 mg total) by mouth daily with breakfast. 10/29/11  Yes [name], MD  metFORMIN (GLUCOPHAGE) 500 MG tablet Take 1 tablet (500 mg total) by mouth 2 (two) times daily with a meal. 06/01/17  Yes [name], MD   Social History   Socioeconomic History  . Marital status: Divorced    Spouse name: Not on file  . Number of children: 2  . Years of education: Not on file  . Highest education level: Not on file  Occupational History  . 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 Financial resource strain: Not on file  . Food  insecurity:    Worry: Not on file    Inability: Not on file  . Transportation needs:    Medical: Not on file    Non-medical: Not on file  Tobacco Use  . Smoking status: Never Smoker  . Smokeless tobacco: Never Used  Substance and Sexual Activity  . Alcohol use: No    Alcohol/week: 0.0 standard drinks  . Drug use: No  . Sexual activity: Never  Lifestyle  . Physical activity:    Days per week: Not on file    Minutes per session: Not on file  . Stress: Not on file  Relationships  . Social connections:    Talks on phone: Not on file    Gets together: Not on file    Attends religious service: Not on file    Active member of club or organization: Not on file    Attends meetings of clubs or organizations: Not on file    Relationship status: Not on file  . Intimate partner violence:    Fear of current or ex partner: Not on file    Emotionally abused: Not on file    Physically abused: Not on file    Forced sexual activity: Not on file  Other Topics Concern  . Not on file  Social History Narrative  . Not on file   Review of Systems  Musculoskeletal: Positive for arthralgias and neck stiffness.      Objective:   Physical Exam  Constitutional: She is oriented to person, place, and time. She appears well-developed and well-nourished. No distress.  HENT:  Head: Normocephalic and atraumatic.  Clicking at L TMJ. Firm area/fullness of L submandibular gland, nonpainful.  Eyes: Conjunctivae and EOM are normal.  Neck: Neck supple. No tracheal deviation present.  Slight tenderness and spasms over L trapezius. Slight tenderness upper deltoid. FROM bilat shoulders. L rotation ~45-50 degrees, same with R rotation. 30-40 degrees of lateral flexion, full flexion, lacking 15-20 degrees extension. Equal strength in UE bilat.  Cardiovascular: Normal rate.  Pulmonary/Chest: Effort normal. No respiratory distress.  Musculoskeletal: Normal range of motion.  Neurological: She is alert and  oriented to person, place, and time.  Skin: Skin is warm and dry.  Psychiatric: She has a normal mood and affect. Her behavior is normal.  Nursing note and vitals reviewed.  Vitals:   10/14/17 1026  BP: 137/76  Pulse: 71  Temp: 98.2 F (36.8 C)  TempSrc: Oral  SpO2: 97%  Weight: 191 lb 3.2 oz (86.7 kg)  Height: 5\' 6"  (1.676 m)      Assessment & Plan:  Robin Delacruz is a 52 y.o. female Low back pain, unspecified back pain  laterality, unspecified chronicity, with sciatica presence unspecified Cervical radiculopathy  -Improvement with physical therapy.  Continue Flexeril as needed, recheck 6 weeks to determine if orthopedic evaluated at that time, sooner if worse.  Need for influenza vaccination - Plan: Flu Vaccine QUAD 36+ mos IM  TMJ syndrome  -Left greater than right.  Handout given.  Flexeril may be helpful, episodic NSAID may be also needed.  Sialolithiasis of submandibular gland - Plan: Ambulatory referral to ENT  -Does experience fullness of the left jawline, likely sialolith.  Will refer to ENT decide if other imaging or treatment needed.  No orders of the defined types were placed in this encounter.  Patient Instructions   Continue physical therapy, okay to continue Flexeril as needed.  Let me know if you do need a refill of that medication.  Follow-up in 6 weeks and we can decide at that point further imaging or meeting with orthopedics is needed.  I am happy to see you sooner if needed.  See information below on TMJ syndrome.  Episodic use of ibuprofen may be helpful, can also discuss other treatment with your dentist.  Muscle relaxant additionally can help if needed.   I will refer you to ear nose and throat for the possible sialolith.  Return to the clinic or go to the nearest emergency room if any of your symptoms worsen or new symptoms occur.   Temporomandibular Joint Syndrome Temporomandibular joint (TMJ) syndrome is a condition that affects the joints  between your jaw and your skull. The TMJs are located near your ears and allow your jaw to open and close. These joints and the nearby muscles are involved in all movements of the jaw. People with TMJ syndrome have pain in the area of these joints and muscles. Chewing, biting, or other movements of the jaw can be difficult or painful. TMJ syndrome can be caused by various things. In many cases, the condition is mild and goes away within a few weeks. For some people, the condition can become a long-term problem. What are the causes? Possible causes of TMJ syndrome include:  Grinding your teeth or clenching your jaw. Some people do this when they are under stress.  Arthritis.  Injury to the jaw.  Head or neck injury.  Teeth or dentures that are not aligned well.  In some cases, the cause of TMJ syndrome may not be known. What are the signs or symptoms? The most common symptom is an aching pain on the side of the head in the area of the TMJ. Other symptoms may include:  Pain when moving your jaw, such as when chewing or biting.  Being unable to open your jaw all the way.  Making a clicking sound when you open your mouth.  Headache.  Earache.  Neck or shoulder pain.  How is this diagnosed? Diagnosis can usually be made based on your symptoms, your medical history, and a physical exam. Your health care provider may check the range of motion of your jaw. Imaging tests, such as X-rays or an MRI, are sometimes done. You may need to see your dentist to determine if your teeth and jaw are lined up correctly. How is this treated? TMJ syndrome often goes away on its own. If treatment is needed, the options may include:  Eating soft foods and applying ice or heat.  Medicines to relieve pain or inflammation.  Medicines to relax the muscles.  A splint, bite plate, or mouthpiece to prevent teeth grinding or jaw clenching.  Relaxation techniques or counseling to help reduce  stress.  Transcutaneous electrical nerve stimulation (TENS). This helps to relieve pain by applying an electrical current through the skin.  Acupuncture. This is sometimes helpful to relieve pain.  Jaw surgery. This is rarely needed.  Follow these instructions at home:  Take medicines only as directed by your health care provider.  Eat a soft diet if you are having trouble chewing.  Apply ice to the painful area. ? Put ice in a plastic bag. ? Place a towel between your skin and the bag. ? Leave the ice on for 20 minutes, 2-3 times a day.  Apply a warm compress to the painful area as directed.  Massage your jaw area and perform any jaw stretching exercises as recommended by your health care provider.  If you were given a mouthpiece or bite plate, wear it as directed.  Avoid foods that require a lot of chewing. Do not chew gum.  Keep all follow-up visits as directed by your health care provider. This is important. Contact a health care provider if:  You are having trouble eating.  You have new or worsening symptoms. Get help right away if:  Your jaw locks open or closed. This information is not intended to replace advice given to you by your health care provider. Make sure you discuss any questions you have with your health care provider. Document Released: 10/14/2000 Document Revised: 09/19/2015 Document Reviewed: 08/24/2013 Elsevier Interactive Patient Education  [employer].   If you have lab work done today you will be contacted with your lab results within the next 2 weeks.  If you have not heard from [place] then please contact us. The fastest way to get your results is to register for My Chart.   IF you received an x-ray today, you will receive an invoice from [facility]. Please contact [facility] at [phone] with questions or concerns regarding your invoice.   IF you received labwork today, you will receive an invoice from [facility]. Please  contact [facility] at [phone] with questions or concerns regarding your invoice.   Our billing staff will not be able to assist you with questions regarding bills from these companies.  You will be contacted with the lab results as soon as they are available. The fastest way to get your results is to activate your My Chart account. Instructions are located on the last page of this paperwork. If you have not heard from [place] regarding the results in 2 weeks, please contact this office.       I personally performed the services described in this documentation, which was scribed in my presence. The recorded information has been reviewed and considered for accuracy and completeness, addended by me as needed, and agree with information above.  Signed,   Meredith Staggers, MD Primary Care at [facility].  10/14/17 10:06 PM

## 2017-10-18 ENCOUNTER — Ambulatory Visit: Payer: 59 | Admitting: Physical Therapy

## 2017-10-19 ENCOUNTER — Encounter: Payer: Self-pay | Admitting: Physical Therapy

## 2017-10-19 ENCOUNTER — Ambulatory Visit: Payer: 59 | Admitting: Physical Therapy

## 2017-10-19 DIAGNOSIS — M542 Cervicalgia: Secondary | ICD-10-CM

## 2017-10-19 DIAGNOSIS — R252 Cramp and spasm: Secondary | ICD-10-CM

## 2017-10-19 DIAGNOSIS — M545 Low back pain, unspecified: Secondary | ICD-10-CM

## 2017-10-19 NOTE — Therapy (Signed)
[facility] [address] [place], [place], [address] Phone: [phone]   Fax:  [phone]  Physical Therapy Treatment  Patient Details  Name: Robin Delacruz MRN: [id number] Date of Birth: November 12, 1965 Referring Provider: Dorna BloomJeffery Green   Encounter Date: 10/19/2017  PT End of Session - 10/19/17 1559    Visit Number  2    Date for PT Re-Evaluation  12/13/17    PT Start Time  1515    PT Stop Time  1613    PT Time Calculation (min)  58 min    Activity Tolerance  Patient tolerated treatment well    Behavior During Therapy  [facility] for tasks assessed/performed       Past Medical History:  Diagnosis Date  . Allergy    seasonal  . Anemia   . Chest pain   . Diabetes mellitus    Diagnosed at age 52  . Dyspnea   . Fatigue   . Hypertension   . Ovarian cyst     Past Surgical History:  Procedure Laterality Date  . ABLATION    . CESAREAN SECTION    . TONSILLECTOMY    . TUBAL LIGATION      There were no vitals filed for this visit.  Subjective Assessment - 10/19/17 1516    Subjective  Positive response from modalities.    Currently in Pain?  Yes   because she fell on it   Pain Score  7     Pain Location  Shoulder    Pain Orientation  Left                       [facility] Adult PT Treatment/Exercise - 10/19/17 0001      Exercises   Exercises  Shoulder      Shoulder Exercises: Standing   Horizontal ABduction  20 reps;Theraband;Both    External Rotation  Theraband;20 reps;Both    Theraband Level (Shoulder External Rotation)  Level 2 (Red)    Extension  Theraband;20 reps;Both    Theraband Level (Shoulder Extension)  Level 2 (Red)    Row  Theraband;20 reps;Both    Theraband Level (Shoulder Row)  Level 2 (Red)      Modalities   Modalities  Electrical Stimulation;Moist Heat      Moist Heat Therapy   Number Minutes Moist Heat  15 Minutes    Moist Heat Location  Shoulder      Electrical  Stimulation   Electrical Stimulation Location  left upper trap and into the rhomboid    Electrical Stimulation Action  IFC    Electrical Stimulation Parameters  supine    Electrical Stimulation Goals  Pain      Manual Therapy   Manual Therapy  Passive ROM;Soft tissue mobilization    Soft tissue mobilization  postrior paraspinales.    Passive ROM  Cervical spine               PT Short Term Goals - 10/13/17 0831      PT SHORT TERM GOAL #1   Title  independent with initial HEP    Time  2    Period  Weeks        PT Long Term Goals - 10/13/17 [id number]      PT LONG TERM GOAL #1   Title  understand proper posture and body mechanics    Time  8    Period  Weeks    Status  New      PT LONG TERM GOAL #2   Title  indepednent and safe with gym exercises    Time  8    Period  Weeks    Status  New      PT LONG TERM GOAL #3   Title  decreas pain 50%    Time  8    Period  Weeks    Status  New      PT LONG TERM GOAL #4   Title  increase cerviccal ROM 25%    Time  8    Period  Weeks    Status  New            Plan - 10/19/17 1602    Clinical Impression Statement  Pt tolerated an initial progression to exercises well. She does report increase L shoulder pain. She stated the pain came from her training today getting slammed on it, Significant spasms and trigger points remain in upper trap and rhomboid on L side.    Rehab Potential  Good    PT Frequency  2x / week    PT Duration  8 weeks    PT Treatment/Interventions  ADLs/Self Care Home Management;Iontophoresis 4mg /ml Dexamethasone;Cryotherapy;Electrical Stimulation;Ultrasound;Traction;Moist Heat;Therapeutic activities;Therapeutic exercise;Manual techniques;Patient/family education;Dry needling    PT Next Visit Plan  continue gym, could try traction, STM or DN       Patient will benefit from skilled therapeutic intervention in order to improve the following deficits and impairments:  Decreased range of motion,  Difficulty walking, Increased muscle spasms, Increased fascial restricitons, Pain, Improper body mechanics, Impaired flexibility, Decreased mobility, Decreased strength, Postural dysfunction  Visit Diagnosis: Cervicalgia  Cramp and spasm  Acute bilateral low back pain without sciatica     Problem List Patient Active Problem List   Diagnosis Date Noted  . Type 2 diabetes mellitus without complication (HCC) 03/05/2014  . Ovarian cyst   . Anemia   . Dyspnea   . Fatigue   . Chest pain 11/02/2011  . Hypertension     Grayce Sessions, PTA 10/19/2017, 4:04 PM  [facility] [address] [place], [place], [address] Phone: [phone]   Fax:  [phone]  Name: Robin Delacruz MRN: [id number] Date of Birth: May 13, 1965

## 2017-10-21 ENCOUNTER — Encounter: Payer: Self-pay | Admitting: Physical Therapy

## 2017-10-21 ENCOUNTER — Ambulatory Visit: Payer: 59 | Admitting: Physical Therapy

## 2017-10-21 DIAGNOSIS — M542 Cervicalgia: Secondary | ICD-10-CM

## 2017-10-21 DIAGNOSIS — R252 Cramp and spasm: Secondary | ICD-10-CM

## 2017-10-21 NOTE — Therapy (Signed)
[facility] [address] [place], [place], [address] Phone: [phone]   Fax:  [phone]  Physical Therapy Treatment  Patient Details  Name: Robin Delacruz MRN: [id number] Date of Birth: 1966/02/01 Referring Provider: Dorna BloomJeffery Green   Encounter Date: 10/21/2017  PT End of Session - 10/21/17 1515    Visit Number  3    Date for PT Re-Evaluation  12/13/17    PT Start Time  1430    PT Stop Time  1529    PT Time Calculation (min)  59 min    Activity Tolerance  Patient tolerated treatment well    Behavior During Therapy  [facility] for tasks assessed/performed       Past Medical History:  Diagnosis Date  . Allergy    seasonal  . Anemia   . Chest pain   . Diabetes mellitus    Diagnosed at age 52  . Dyspnea   . Fatigue   . Hypertension   . Ovarian cyst     Past Surgical History:  Procedure Laterality Date  . ABLATION    . CESAREAN SECTION    . TONSILLECTOMY    . TUBAL LIGATION      There were no vitals filed for this visit.  Subjective Assessment - 10/21/17 1434    Subjective  "Just stiff and sore today"    Currently in Pain?  Yes    Pain Location  Shoulder    Pain Orientation  Posterior    Pain Descriptors / Indicators  Aching                       [facility] Adult PT Treatment/Exercise - 10/21/17 0001      Exercises   Exercises  Shoulder      Shoulder Exercises: Standing   External Rotation  Theraband;20 reps;Both    Theraband Level (Shoulder External Rotation)  Level 2 (Red)    Flexion  Weights;Both;Strengthening;15 reps   x2   Shoulder Flexion Weight (lbs)  1    ABduction  15 reps;Weights   x2   Shoulder ABduction Weight (lbs)  1    Extension  Theraband;20 reps;Both    Theraband Level (Shoulder Extension)  Level 2 (Red)    Row  Theraband;20 reps;Both    Theraband Level (Shoulder Row)  Level 2 (Red)      Shoulder Exercises: ROM/Strengthening   UBE (Upper Arm Bike)  L2 3 min  each way     "W" Arms  20    Other ROM/Strengthening Exercises  Row & lats 20lb 2x10      Modalities   Modalities  Electrical Stimulation;Moist Heat      Moist Heat Therapy   Number Minutes Moist Heat  15 Minutes    Moist Heat Location  Shoulder      Electrical Stimulation   Electrical Stimulation Location  left upper trap and into the rhomboid    Electrical Stimulation Action  IFC    Electrical Stimulation Parameters  supine    Electrical Stimulation Goals  Pain      Manual Therapy   Manual Therapy  Passive ROM;Soft tissue mobilization    Soft tissue mobilization  Lateral deltoid    Passive ROM  L shouldewr all directions               PT Short Term Goals - 10/13/17 0831      PT SHORT TERM GOAL #1   Title  independent  with initial HEP    Time  2    Period  Weeks        PT Long Term Goals - 10/13/17 1610      PT LONG TERM GOAL #1   Title  understand proper posture and body mechanics    Time  8    Period  Weeks    Status  New      PT LONG TERM GOAL #2   Title  indepednent and safe with gym exercises    Time  8    Period  Weeks    Status  New      PT LONG TERM GOAL #3   Title  decreas pain 50%    Time  8    Period  Weeks    Status  New      PT LONG TERM GOAL #4   Title  increase cerviccal ROM 25%    Time  8    Period  Weeks    Status  New            Plan - 10/21/17 1516    Clinical Impression Statement  Pt reports that she is feeling a little better, Reports some pain and discomfort in her lateral L Shoulder with flex, abd, and external rotation. Muscle tightness noted in lateral L deltoid with palpation. Full PROM but has some pain at the end range of flexion and external rotation.    Rehab Potential  Good    PT Frequency  2x / week    PT Duration  8 weeks    PT Treatment/Interventions  ADLs/Self Care Home Management;Iontophoresis 4mg /ml Dexamethasone;Cryotherapy;Electrical Stimulation;Ultrasound;Traction;Moist Heat;Therapeutic  activities;Therapeutic exercise;Manual techniques;Patient/family education;Dry needling    PT Next Visit Plan  continue gym, could try traction, STM or DN       Patient will benefit from skilled therapeutic intervention in order to improve the following deficits and impairments:  Decreased range of motion, Difficulty walking, Increased muscle spasms, Increased fascial restricitons, Pain, Improper body mechanics, Impaired flexibility, Decreased mobility, Decreased strength, Postural dysfunction  Visit Diagnosis: Cervicalgia  Cramp and spasm     Problem List Patient Active Problem List   Diagnosis Date Noted  . Type 2 diabetes mellitus without complication (HCC) 03/05/2014  . Ovarian cyst   . Anemia   . Dyspnea   . Fatigue   . Chest pain 11/02/2011  . Hypertension     Grayce Sessions, PTA 10/21/2017, 3:23 PM  [facility] [address] [place], [place], [address] Phone: [phone]   Fax:  [phone]  Name: Robin Delacruz MRN: [id number] Date of Birth: 1965/04/22

## 2017-10-22 ENCOUNTER — Encounter: Payer: 59 | Admitting: Physical Therapy

## 2017-10-27 ENCOUNTER — Ambulatory Visit: Payer: 59 | Admitting: Physical Therapy

## 2017-11-01 ENCOUNTER — Encounter: Payer: Self-pay | Admitting: Physical Therapy

## 2017-11-01 ENCOUNTER — Ambulatory Visit: Payer: 59 | Admitting: Physical Therapy

## 2017-11-01 DIAGNOSIS — M545 Low back pain, unspecified: Secondary | ICD-10-CM

## 2017-11-01 DIAGNOSIS — R252 Cramp and spasm: Secondary | ICD-10-CM

## 2017-11-01 DIAGNOSIS — M542 Cervicalgia: Secondary | ICD-10-CM
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
## 2017-11-01 NOTE — Patient Instructions (Signed)

## 2017-11-05 ENCOUNTER — Encounter: Payer: Self-pay | Admitting: Physical Therapy

## 2017-11-05 ENCOUNTER — Ambulatory Visit: Payer: 59 | Attending: Family Medicine | Admitting: Physical Therapy

## 2017-11-05 DIAGNOSIS — M542 Cervicalgia: Secondary | ICD-10-CM | POA: Insufficient documentation

## 2017-11-05 DIAGNOSIS — M545 Low back pain, unspecified: Secondary | ICD-10-CM

## 2017-11-05 DIAGNOSIS — R252 Cramp and spasm: Secondary | ICD-10-CM | POA: Insufficient documentation

## 2017-11-05 NOTE — Therapy (Signed)
[place], [place], [address] Phone: [phone]   Fax:  [phone]  Physical Therapy Treatment  Patient Details  Name: Robin Delacruz MRN: [id number] Date of Birth: 01-Aug-1965 Referring Provider (PT): Chinita Pester   Encounter Date: 11/05/2017  PT End of Session - 11/05/17 1056    Visit Number  5    Date for PT Re-Evaluation  11/11/17    PT Start Time  0354    PT Stop Time  1118    PT Time Calculation (min)  63 min    Activity Tolerance  Patient tolerated treatment well    Behavior During Therapy  [facility] for tasks assessed/performed       Past Medical History:  Diagnosis Date  . Allergy    seasonal  . Anemia   . Chest pain   . Diabetes mellitus    Diagnosed at age 52  . Dyspnea   . Fatigue   . Hypertension   . Ovarian cyst     Past Surgical History:  Procedure Laterality Date  . ABLATION    . CESAREAN SECTION    . TONSILLECTOMY    . TUBAL LIGATION      There were no vitals filed for this visit.  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  No/denies    Pain Location  Neck    Pain Descriptors / Indicators  Sore         [facility] PT Assessment - 11/05/17 0001      AROM   Overall AROM Comments  Cervical ROM WNL, some pain with sidebending and L rotation                   [facility] Adult PT Treatment/Exercise - 11/05/17 0001      Exercises   Exercises  Shoulder      Shoulder Exercises: Seated   Other Seated Exercises  bent over 4lb rows, ext, and 3lb rev flys 2x10       Shoulder Exercises: Standing   Other Standing Exercises  "I,Y, and T" 2lb x10      Shoulder Exercises: ROM/Strengthening   UBE (Upper Arm Bike)  L4 3 min each way     Other ROM/Strengthening Exercises  Row & lats 25lb 2x15      Modalities   Modalities  Ultrasound      Moist Heat Therapy   Number Minutes Moist Heat  15 Minutes    Moist Heat Location  Shoulder      Electrical Stimulation   Electrical Stimulation Location  left upper trap and into the rhomboid    Electrical Stimulation Action  IFC    Electrical Stimulation Parameters  supine    Electrical Stimulation Goals  Pain      Ultrasound   Ultrasound Location  Upper traps    Ultrasound Parameters  1Mhz 1.2w/cm2    Ultrasound Goals  Pain               PT Short Term Goals - 11/01/17 1111      PT SHORT TERM GOAL #1   Title  independent with initial HEP    Status  Achieved        PT Long Term Goals - 11/01/17 1111  PT LONG TERM GOAL #1   Title  understand proper posture and body mechanics    Status  Partially Met      PT LONG TERM GOAL #2   Title  indepednent and safe with gym exercises    Status  Partially Met      PT LONG TERM GOAL #3   Title  decreas pain 50%    Status  On-going      PT LONG TERM GOAL #4   Title  increase cerviccal ROM 25%    Status  On-going            Plan - 11/05/17 1058    Clinical Impression Statement  Pt enters clinic reporting soreness in the upper trap areas. No reports of increase pain with the interventions only shoulder fatigue. Trigger point noted in upper L Trap with palpation. Modality for muscle tension.     Rehab Potential  Good    PT Frequency  2x / week    PT Duration  8 weeks    PT Treatment/Interventions  ADLs/Self Care Home Management;Iontophoresis 72m/ml Dexamethasone;Cryotherapy;Electrical Stimulation;Ultrasound;Traction;Moist Heat;Therapeutic activities;Therapeutic exercise;Manual techniques;Patient/family education;Dry needling    PT Next Visit Plan  continue gym, could try traction, STM or DN       Patient will benefit from skilled therapeutic intervention in order to improve the following deficits and impairments:  Decreased range of motion, Difficulty walking,  Increased muscle spasms, Increased fascial restricitons, Pain, Improper body mechanics, Impaired flexibility, Decreased mobility, Decreased strength, Postural dysfunction  Visit Diagnosis: Cramp and spasm  Acute bilateral low back pain without sciatica  Cervicalgia     Problem List Patient Active Problem List   Diagnosis Date Noted  . Type 2 diabetes mellitus without complication ([facility] [place] 086/76/7209 . Ovarian cyst   . Anemia   . Dyspnea   . Fatigue   . Chest pain 11/02/2011  . Hypertension     RScot Jun10/05/2017, 11:07 AM  [place] [address]Phone: [phone]  Fax:  [phone] Name: EZayanna PundtMRN: [id number] of Birth: [birth year]

## 2017-11-11 ENCOUNTER — Ambulatory Visit: Payer: 59 | Admitting: Physical Therapy

## 2017-11-15 ENCOUNTER — Encounter: Payer: 59 | Admitting: Physical Therapy

## 2017-11-19 ENCOUNTER — Encounter: Payer: Self-pay | Admitting: Physical Therapy

## 2017-11-19 ENCOUNTER — Ambulatory Visit: Payer: 59 | Admitting: Physical Therapy

## 2017-11-19 DIAGNOSIS — R252 Cramp and spasm: Secondary | ICD-10-CM | POA: Diagnosis not present

## 2017-11-19 DIAGNOSIS — M545 Low back pain, unspecified: Secondary | ICD-10-CM

## 2017-11-19 DIAGNOSIS — M542 Cervicalgia: Secondary | ICD-10-CM

## 2017-11-19 NOTE — Therapy (Signed)
[facility] [address] [place], [place], [address] Phone: [phone]   Fax:  [phone]  Physical Therapy Treatment  Patient Details  Name: Robin Delacruz MRN: [id number] Date of Birth: 09/24/1965 Referring Provider (PT): Jeffery Green   Encounter Date: 11/19/2017  PT End of Session - 11/19/17 1102    Visit Number  6    Date for PT Re-Evaluation  11/11/17    PT Start Time  1015    PT Stop Time  1112    PT Time Calculation (min)  57 min    Activity Tolerance  Patient tolerated treatment well    Behavior During Therapy  [facility] for tasks assessed/performed       Past Medical History:  Diagnosis Date  . Allergy    seasonal  . Anemia   . Chest pain   . Diabetes mellitus    Diagnosed at age 29  . Dyspnea   . Fatigue   . Hypertension   . Ovarian cyst     Past Surgical History:  Procedure Laterality Date  . ABLATION    . CESAREAN SECTION    . TONSILLECTOMY    . TUBAL LIGATION      There were no vitals filed for this visit.  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 She could not turn her neck that night heating pad and tens unit helped    Currently in Pain?  No/denies    Pain Location  Neck    Pain Descriptors / Indicators  Aching   stiff        [facility] PT Assessment - 11/19/17 0001      AROM   Overall AROM Comments  Cervical ROM WFL some pain with L rotation                    [facility] Adult PT Treatment/Exercise - 11/19/17 0001      Exercises   Exercises  Shoulder      Shoulder Exercises: Standing   Other Standing Exercises  "I,Y, and T" 2lb x10      Shoulder Exercises: ROM/Strengthening   UBE (Upper Arm Bike)  L4 3 min each way     Other ROM/Strengthening Exercises  serratus presses x10     Other ROM/Strengthening Exercises   Row & lats 35lb 2x15      Modalities   Modalities  Ultrasound      Moist Heat Therapy   Number Minutes Moist Heat  15 Minutes    Moist Heat Location  Shoulder      Electrical Stimulation   Electrical Stimulation Location  left upper trap and into the rhomboid    Electrical Stimulation Action  IFC    Electrical Stimulation Parameters  supine    Electrical Stimulation Goals  Pain      Ultrasound   Ultrasound Location  upper traps     Ultrasound Parameters  Combo 1.2Mhz    Ultrasound Goals  Pain      Manual Therapy   Manual Therapy  Soft tissue mobilization    Soft tissue mobilization  mid trap, rhomboid area.               PT Short Term Goals - 11/01/17 1111      PT SHORT TERM GOAL #  1   Title  independent with initial HEP    Status  Achieved        PT Long Term Goals - 11/01/17 1111      PT LONG TERM GOAL #1   Title  understand proper posture and body mechanics    Status  Partially Met      PT LONG TERM GOAL #2   Title  indepednent and safe with gym exercises    Status  Partially Met      PT LONG TERM GOAL #3   Title  decreas pain 50%    Status  On-going      PT LONG TERM GOAL #4   Title  increase cerviccal ROM 25%    Status  On-going            Plan - 11/19/17 1104    Clinical Impression Statement  Pt did well with today's she did report some pain/discomfort around her medial L scapula during "T" exercise. Positive response to [place] e-Stim combo with less tightness and improved mobility.Very noticeable trigger point noted in L supraspinatus area. Decline DN.     Rehab Potential  Good    PT Treatment/Interventions  ADLs/Self Care Home Management;Iontophoresis 50m/ml Dexamethasone;Cryotherapy;Electrical Stimulation;Ultrasound;Traction;Moist Heat;Therapeutic activities;Therapeutic exercise;Manual techniques;Patient/family education;Dry needling    PT Next Visit Plan  continue gym, could try traction, STM or DN       Patient will benefit from  skilled therapeutic intervention in order to improve the following deficits and impairments:  Decreased range of motion, Difficulty walking, Increased muscle spasms, Increased fascial restricitons, Pain, Improper body mechanics, Impaired flexibility, Decreased mobility, Decreased strength, Postural dysfunction  Visit Diagnosis: Cramp and spasm  Acute bilateral low back pain without sciatica  Cervicalgia     Problem List Patient Active Problem List   Diagnosis Date Noted  . Type 2 diabetes mellitus without complication ([facility] [place] 006/23/7628 . Ovarian cyst   . Anemia   . Dyspnea   . Fatigue   . Chest pain 11/02/2011  . Hypertension     RScot Jun PTA 11/19/2017, 11:37 AM  [place] [address]Phone: [phone]  Fax:  [phone] Name: ESidni FuscoMRN: [id number] of Birth: 2Feb 12, 1967

## 2017-11-24 ENCOUNTER — Ambulatory Visit: Payer: 59 | Admitting: Physical Therapy

## 2017-11-25 ENCOUNTER — Ambulatory Visit: Payer: 59 | Admitting: Family Medicine

## 2017-11-29 ENCOUNTER — Ambulatory Visit: Payer: 59 | Admitting: Physical Therapy

## 2017-12-03 ENCOUNTER — Ambulatory Visit: Payer: 59 | Attending: Family Medicine | Admitting: Physical Therapy

## 2017-12-03 DIAGNOSIS — M542 Cervicalgia: Secondary | ICD-10-CM | POA: Insufficient documentation

## 2017-12-03 DIAGNOSIS — R252 Cramp and spasm: Secondary | ICD-10-CM | POA: Insufficient documentation

## 2017-12-03 DIAGNOSIS — M545 Low back pain: Secondary | ICD-10-CM | POA: Insufficient documentation

## 2017-12-07 ENCOUNTER — Ambulatory Visit: Payer: 59 | Admitting: Family Medicine

## 2017-12-08 ENCOUNTER — Ambulatory Visit: Payer: 59 | Admitting: Physical Therapy

## 2017-12-08 ENCOUNTER — Encounter: Payer: Self-pay | Admitting: Physical Therapy

## 2017-12-08 DIAGNOSIS — R252 Cramp and spasm: Secondary | ICD-10-CM | POA: Diagnosis present

## 2017-12-08 DIAGNOSIS — M545 Low back pain, unspecified: Secondary | ICD-10-CM

## 2017-12-08 DIAGNOSIS — M542 Cervicalgia: Secondary | ICD-10-CM

## 2017-12-08 NOTE — Therapy (Signed)
[facility] [address] [place], [place], [address] Phone: [phone]   Fax:  [phone]  Physical Therapy Treatment  Patient Details  Name: Robin Delacruz MRN: [id number] Date of Birth: 12-02-1965 Referring Provider (PT): Dorna Bloom   Encounter Date: 12/08/2017  PT End of Session - 12/08/17 1105    Visit Number  7    Date for PT Re-Evaluation  12/13/17    PT Start Time  1012    PT Stop Time  1103    PT Time Calculation (min)  51 min    Activity Tolerance  Patient tolerated treatment well    Behavior During Therapy  [facility] for tasks assessed/performed       Past Medical History:  Diagnosis Date  . Allergy    seasonal  . Anemia   . Chest pain   . Diabetes mellitus    Diagnosed at age 34  . Dyspnea   . Fatigue   . Hypertension   . Ovarian cyst     Past Surgical History:  Procedure Laterality Date  . ABLATION    . CESAREAN SECTION    . TONSILLECTOMY    . TUBAL LIGATION      There were no vitals filed for this visit.  Subjective Assessment - 12/08/17 1014    Subjective  "I am good, My shoulder is doing better, loosening up"     Currently in Pain?  No/denies         [facility] PT Assessment - 12/08/17 0001      AROM   Overall AROM Comments  Cervical ROM [facility]                   [facility] Adult PT Treatment/Exercise - 12/08/17 0001      Exercises   Exercises  Shoulder      Shoulder Exercises: Standing   Horizontal ABduction  20 reps;Theraband;Both    Theraband Level (Shoulder Horizontal ABduction)  Level 3 (Green)    External Rotation  Theraband;20 reps;Both    Theraband Level (Shoulder External Rotation)  Level 3 (Green)    Flexion  Weights;Both;Strengthening;20 reps    Shoulder Flexion Weight (lbs)  2    ABduction  Weights;20 reps;Both;Strengthening    Shoulder ABduction Weight (lbs)  2    Other Standing Exercises  Bicep curls 20lb 2x10    Other Standing Exercises  Tricep 35lb  2x10       Shoulder Exercises: ROM/Strengthening   UBE (Upper Arm Bike)  L5  3 min each way     Other ROM/Strengthening Exercises  serratus presses 15lb 2x10     Other ROM/Strengthening Exercises  Row & lats 35lb 2x15      Ultrasound   Ultrasound Location  upper traps    Ultrasound Parameters  1.1w/wm2    Ultrasound Goals  Pain               PT Short Term Goals - 11/01/17 1111      PT SHORT TERM GOAL #1   Title  independent with initial HEP    Status  Achieved        PT Long Term Goals - 12/08/17 1032      PT LONG TERM GOAL #4   Title  increase cerviccal ROM 25%    Status  Achieved            Plan - 12/08/17 1106    Clinical Impression Statement  Pt reports an  overall improvement with her L shoulder She reports less pain and tightness. Tolerated machine level interventions well. She reports a pulling/tight sensation in L shoulder with ER and horizontal abduction. Positive response to [place].     Rehab Potential  Good    PT Frequency  2x / week    PT Treatment/Interventions  ADLs/Self Care Home Management;Iontophoresis 4mg /ml Dexamethasone;Cryotherapy;Electrical Stimulation;Ultrasound;Traction;Moist Heat;Therapeutic activities;Therapeutic exercise;Manual techniques;Patient/family education;Dry needling    PT Next Visit Plan  continue gym, could try traction, STM or DN       Patient will benefit from skilled therapeutic intervention in order to improve the following deficits and impairments:  Decreased range of motion, Difficulty walking, Increased muscle spasms, Increased fascial restricitons, Pain, Improper body mechanics, Impaired flexibility, Decreased mobility, Decreased strength, Postural dysfunction  Visit Diagnosis: Cramp and spasm  Acute bilateral low back pain without sciatica  Cervicalgia     Problem List Patient Active Problem List   Diagnosis Date Noted  . Type 2 diabetes mellitus without complication (HCC) 03/05/2014  . Ovarian cyst    . Anemia   . Dyspnea   . Fatigue   . Chest pain 11/02/2011  . Hypertension     Kennon Portela 12/08/2017, 11:09 AM  [facility] [address] [place], [place], [address] Phone: [phone]   Fax:  [phone]  Name: Robin Delacruz MRN: [id number] Date of Birth: [birth year]

## 2017-12-09 ENCOUNTER — Ambulatory Visit: Payer: 59 | Admitting: Family Medicine

## 2017-12-11 DIAGNOSIS — Z23 Encounter for immunization: Secondary | ICD-10-CM | POA: Diagnosis not present

## 2017-12-13 ENCOUNTER — Ambulatory Visit: Payer: 59 | Admitting: Physical Therapy

## 2017-12-13 ENCOUNTER — Encounter: Payer: Self-pay | Admitting: Physical Therapy

## 2017-12-13 DIAGNOSIS — R252 Cramp and spasm: Secondary | ICD-10-CM

## 2017-12-13 DIAGNOSIS — M545 Low back pain, unspecified: Secondary | ICD-10-CM

## 2017-12-13 DIAGNOSIS — M542 Cervicalgia: Secondary | ICD-10-CM

## 2017-12-13 NOTE — Therapy (Signed)
[place], [place], [address] Phone: [phone]   Fax:  [phone]  Physical Therapy Treatment  Patient Details  Name: Robin Delacruz MRN: [id number] Date of Birth: Apr 05, 1965 Referring Provider (PT): Chinita Pester   Encounter Date: 12/13/2017  PT End of Session - 12/13/17 1111    Visit Number  8    Date for PT Re-Evaluation  12/13/17    PT Start Time  1006    PT Stop Time  1102    PT Time Calculation (min)  56 min    Activity Tolerance  Patient tolerated treatment well    Behavior During Therapy  [facility] for tasks assessed/performed       Past Medical History:  Diagnosis Date  . Allergy    seasonal  . Anemia   . Chest pain   . Diabetes mellitus    Diagnosed at age 25  . Dyspnea   . Fatigue   . Hypertension   . Ovarian cyst     Past Surgical History:  Procedure Laterality Date  . ABLATION    . CESAREAN SECTION    . TONSILLECTOMY    . TUBAL LIGATION      There were no vitals filed for this visit.  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  No/denies    Pain Location  Scapula    Pain Orientation  Left    Pain Descriptors / Indicators  Sore                       [facility] Adult PT Treatment/Exercise - 12/13/17 0001      Exercises   Exercises  Shoulder      Shoulder Exercises: Seated   Other Seated Exercises  bent over 7lb rows, 4lb ext, and 3lb rev flys 2x10       Shoulder Exercises: Standing   ABduction  Weights;20 reps;Both;Strengthening    Shoulder ABduction Weight (lbs)  2    Row  Theraband;20 reps;Both;Strengthening   with ER    Theraband Level (Shoulder Row)  Level 3 (Green)    Other Standing Exercises  3 way ER 3lb x10 eacch       Shoulder Exercises: ROM/Strengthening   UBE (Upper Arm Bike)  L5   3 min each way     Other ROM/Strengthening Exercises  serratus presses 15lb 2x10     Other ROM/Strengthening Exercises  Row & lats 45lb 2x10      Ultrasound   Ultrasound Location  upper traps     Ultrasound Parameters  ombo 1MHz 1.1w/cm2     Ultrasound Goals  Pain               PT Short Term Goals - 11/01/17 1111      PT SHORT TERM GOAL #1   Title  independent with initial HEP    Status  Achieved        PT Long Term Goals - 12/13/17 1123      PT LONG TERM GOAL #1   Title  understand proper posture and body mechanics    Status  Achieved      PT LONG TERM GOAL #2   Title  indepednent and safe with gym exercises    Status  Achieved  PT LONG TERM GOAL #3   Title  decreas pain 50%    Status  Partially Met      PT LONG TERM GOAL #4   Title  increase cerviccal ROM 25%    Status  Achieved            Plan - 12/13/17 1114    Clinical Impression Statement  Pt enters clinic reporting increase tightness in her upper L trap that went away after treatment. UE fatigue quick with 3 way external rotation of UE . Good strength on seated rows and lat pull downs. Some struggle with bent over rev fly's. 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      Rehab Potential  Good    PT Frequency  2x / week    PT Duration  8 weeks    PT Treatment/Interventions  ADLs/Self Care Home Management;Iontophoresis 45m/ml Dexamethasone;Cryotherapy;Electrical Stimulation;Ultrasound;Traction;Moist Heat;Therapeutic activities;Therapeutic exercise;Manual techniques;Patient/family education;Dry needling    PT Next Visit Plan  continue gym, could try traction, STM or DN       Patient will benefit from skilled therapeutic intervention in order to improve the following deficits and impairments:  Decreased range of motion, Difficulty walking, Increased muscle spasms, Increased fascial restricitons, Pain,  Improper body mechanics, Impaired flexibility, Decreased mobility, Decreased strength, Postural dysfunction  Visit Diagnosis: Cramp and spasm  Acute bilateral low back pain without sciatica  Cervicalgia     Problem List Patient Active Problem List   Diagnosis Date Noted  . Type 2 diabetes mellitus without complication ([facility] [place] [facility] 033/43/5686 . Ovarian cyst   . Anemia   . Dyspnea   . Fatigue   . Chest pain 11/02/2011  . Hypertension     RScot Jun PTA 12/13/2017, 11:23 AM  [place] [address]Phone: [phone]  Fax:  [phone] Name: Robin MellandMRN: [id number] of Birth: [birth year]

## 2017-12-17 DIAGNOSIS — M542 Cervicalgia: Secondary | ICD-10-CM | POA: Diagnosis not present

## 2017-12-17 DIAGNOSIS — R252 Cramp and spasm: Secondary | ICD-10-CM | POA: Diagnosis not present

## 2017-12-17 DIAGNOSIS — M545 Low back pain: Secondary | ICD-10-CM | POA: Diagnosis not present

## 2017-12-22 ENCOUNTER — Ambulatory Visit: Payer: 59 | Admitting: Physical Therapy

## 2017-12-22 ENCOUNTER — Encounter: Payer: Self-pay | Admitting: Physical Therapy

## 2017-12-22 DIAGNOSIS — R252 Cramp and spasm: Secondary | ICD-10-CM

## 2017-12-22 DIAGNOSIS — M542 Cervicalgia: Secondary | ICD-10-CM

## 2017-12-22 DIAGNOSIS — M545 Low back pain, unspecified: Secondary | ICD-10-CM

## 2017-12-22 NOTE — Therapy (Signed)
[place] [address] [place], [place], [address] Phone: [phone]   Fax:  [phone]  Physical Therapy Treatment  Patient Details  Name: Robin Delacruz MRN: [id number] Date of Birth: 1965/11/03 Referring Provider (PT): Chinita Pester   Encounter Date: 12/22/2017  PT End of Session - 12/22/17 1103    Visit Number  9    Date for PT Re-Evaluation  12/13/17    PT Start Time  1008    PT Stop Time  1110    PT Time Calculation (min)  62 min    Activity Tolerance  Patient tolerated treatment well    Behavior During Therapy  [facility] for tasks assessed/performed       Past Medical History:  Diagnosis Date  . Allergy    seasonal  . Anemia   . Chest pain   . Diabetes mellitus    Diagnosed at age 11  . Dyspnea   . Fatigue   . Hypertension   . Ovarian cyst     Past Surgical History:  Procedure Laterality Date  . ABLATION    . CESAREAN SECTION    . TONSILLECTOMY    . TUBAL LIGATION      There were no vitals filed for this visit.  Subjective Assessment - 12/22/17 1003    Subjective  "Good today, a little sore but nothing major"    Currently in Pain?  No/denies                       [facility] Adult PT Treatment/Exercise - 12/22/17 0001      Exercises   Exercises  Shoulder      Shoulder Exercises: Standing   Other Standing Exercises  3 way ER 3lb x10 eacch     Other Standing Exercises  Tricep 35lb 2x10       Shoulder Exercises: ROM/Strengthening   UBE (Upper Arm Bike)  constant work 20 watts 3 min each way    Other ROM/Strengthening Exercises  serratus presses 15lb 2x10     Other ROM/Strengthening Exercises  Row 2x15 & lats 2x10 45lb      Moist Heat Therapy   Number Minutes Moist Heat  15 Minutes    Moist Heat Location  Shoulder      Electrical Stimulation   Electrical Stimulation Location  left upper trap and into the rhomboid    Electrical Stimulation Action  IFC    Electrical  Stimulation Parameters  supine    Electrical Stimulation Goals  Pain      Manual Therapy   Manual Therapy  Soft tissue mobilization;Passive ROM;Manual Traction    Soft tissue mobilization  cervical paraspinale, upper traps    Passive ROM  cervical spine, pulling sensation with R rotation     Manual Traction  cervical spine 5x15''               PT Short Term Goals - 11/01/17 1111      PT SHORT TERM GOAL #1   Title  independent with initial HEP    Status  Achieved        PT Long Term Goals - 12/13/17 1123      PT LONG TERM GOAL #1   Title  understand proper posture and body mechanics    Status  Achieved      PT LONG TERM GOAL #2   Title  indepednent and safe with gym exercises    Status  Achieved  PT LONG TERM GOAL #3   Title  decreas pain 50%    Status  Partially Met      PT LONG TERM GOAL #4   Title  increase cerviccal ROM 25%    Status  Achieved            Plan - 12/22/17 1104    Clinical Impression Statement  Pt continues to fatigue with 3 way external rotation. She demos good strength and ROM overall but continues to have soreness and pain in her upper L trap area after work. Pain and tenderness noted in upper L trap with MT. multiple trigger points also noted. Pt's job requires her to be physical at times.     Rehab Potential  Good    PT Frequency  2x / week    PT Duration  8 weeks    PT Treatment/Interventions  ADLs/Self Care Home Management;Iontophoresis 15m/ml Dexamethasone;Cryotherapy;Electrical Stimulation;Ultrasound;Traction;Moist Heat;Therapeutic activities;Therapeutic exercise;Manual techniques;Patient/family education;Dry needling    PT Next Visit Plan  Scapular stabilization and strengthening possible DN       Patient will benefit from skilled therapeutic intervention in order to improve the following deficits and impairments:  Decreased range of motion, Difficulty walking, Increased muscle spasms, Increased fascial restricitons, Pain,  Improper body mechanics, Impaired flexibility, Decreased mobility, Decreased strength, Postural dysfunction  Visit Diagnosis: Cramp and spasm  Acute bilateral low back pain without sciatica  Cervicalgia     Problem List Patient Active Problem List   Diagnosis Date Noted  . Type 2 diabetes mellitus without complication (HDaniels 078/67/5449 . Ovarian cyst   . Anemia   . Dyspnea   . Fatigue   . Chest pain 11/02/2011  . Hypertension     RScot Jun PTA 12/22/2017, 11:08 AM  [facility] [address]Phone: [phone]  Fax:  [phone] Name: Robin DolderMRN: [id number] of Birth: [birth year]

## 2017-12-23 ENCOUNTER — Other Ambulatory Visit: Payer: Self-pay | Admitting: Family Medicine

## 2017-12-23 DIAGNOSIS — I10 Essential (primary) hypertension: Secondary | ICD-10-CM

## 2017-12-23 DIAGNOSIS — E785 Hyperlipidemia, unspecified: Secondary | ICD-10-CM

## 2017-12-28 ENCOUNTER — Ambulatory Visit: Payer: 59 | Admitting: Physical Therapy

## 2018-01-05 ENCOUNTER — Ambulatory Visit: Payer: 59 | Attending: Family Medicine | Admitting: Physical Therapy

## 2018-01-05 ENCOUNTER — Encounter: Payer: Self-pay | Admitting: Physical Therapy

## 2018-01-05 DIAGNOSIS — M542 Cervicalgia: Secondary | ICD-10-CM | POA: Diagnosis present

## 2018-01-05 DIAGNOSIS — R252 Cramp and spasm: Secondary | ICD-10-CM | POA: Insufficient documentation

## 2018-01-05 DIAGNOSIS — M545 Low back pain, unspecified: Secondary | ICD-10-CM

## 2018-01-05 NOTE — Therapy (Signed)
[place], [place], [address] Phone: [phone]   Fax:  [phone]  Physical Therapy Treatment  Patient Details  Name: Robin Delacruz MRN: [id number] Date of Birth: 1965/07/29 Referring Provider (PT): Chinita Pester   Encounter Date: 01/05/2018  PT End of Session - 01/05/18 1125    Visit Number  10    Date for PT Re-Evaluation  01/13/18    PT Start Time  0938    PT Stop Time  1121    PT Time Calculation (min)  58 min    Activity Tolerance  Patient tolerated treatment well    Behavior During Therapy  [facility] for tasks assessed/performed       Past Medical History:  Diagnosis Date  . Allergy    seasonal  . Anemia   . Chest pain   . Diabetes mellitus    Diagnosed at age 65  . Dyspnea   . Fatigue   . Hypertension   . Ovarian cyst     Past Surgical History:  Procedure Laterality Date  . ABLATION    . CESAREAN SECTION    . TONSILLECTOMY    . TUBAL LIGATION      There were no vitals filed for this visit.  Subjective Assessment - 01/05/18 1028    Subjective  "It is good" "Just a little sore and stiff, not too bad" Pt stated that she has not had dot take anyone down on her job    Currently in Pain?  No/denies    Pain Location  Shoulder    Pain Orientation  Left                       [facility] Adult PT Treatment/Exercise - 01/05/18 0001      Exercises   Exercises  Shoulder      Shoulder Exercises: Standing   Flexion  Weights;Both;Strengthening;20 reps    Shoulder Flexion Weight (lbs)  4 then 5    ABduction  Weights;20 reps;Both;Strengthening    Shoulder ABduction Weight (lbs)  4 then 5      Shoulder Exercises: ROM/Strengthening   UBE (Upper Arm Bike)  L4 3 min each way     Other ROM/Strengthening Exercises  serratus presses 15lb 2x10, chest press 15lb 2x10    Other ROM/Strengthening Exercises  Row 2x15 & lats 2x10 45lb      Ultrasound   Ultrasound Location  L  upper traps to medial  L scapula     Ultrasound Parameters  1Mhz 1.3 w/cm2    Ultrasound Goals  Pain               PT Short Term Goals - 11/01/17 1111      PT SHORT TERM GOAL #1   Title  independent with initial HEP    Status  Achieved        PT Long Term Goals - 01/05/18 1126      PT LONG TERM GOAL #3   Title  decreas pain 50%    Status  Partially Met      PT LONG TERM GOAL #4   Title  increase cerviccal ROM 25%    Status  Achieved            Plan - 01/05/18 1127    Clinical Impression Statement  Pt reports that she has had a few good days with little to no shoulder pain, but continues to have some soreness.  Good strength with all rowing activities. Weakness noted with lat pull downs and bent over reverse fly's. Tenderness with palpation in upper L trap and  the medial boarder of her L scapula    PT Frequency  2x / week    PT Duration  8 weeks    PT Treatment/Interventions  ADLs/Self Care Home Management;Iontophoresis 5m/ml Dexamethasone;Cryotherapy;Electrical Stimulation;Ultrasound;Traction;Moist Heat;Therapeutic activities;Therapeutic exercise;Manual techniques;Patient/family education;Dry needling    PT Next Visit Plan  Scapular stabilization and strengthening possible DN       Patient will benefit from skilled therapeutic intervention in order to improve the following deficits and impairments:  Decreased range of motion, Difficulty walking, Increased muscle spasms, Increased fascial restricitons, Pain, Improper body mechanics, Impaired flexibility, Decreased mobility, Decreased strength, Postural dysfunction  Visit Diagnosis: Cramp and spasm  Cervicalgia  Acute bilateral low back pain without sciatica     Problem List Patient Active Problem List   Diagnosis Date Noted  . Type 2 diabetes mellitus without complication ([place] 043/71/9070 . Ovarian cyst   . Anemia   . Dyspnea   . Fatigue   . Chest pain 11/02/2011  . Hypertension     RScot Jun12/05/2017, 11:37 AM  [place] [address]Phone: [phone]  Fax:  [phone] Name: Robin LedesmaMRN: [id number] of Birth: 210/06/1965

## 2018-01-11 ENCOUNTER — Ambulatory Visit: Payer: 59 | Admitting: Physical Therapy

## 2018-01-19 ENCOUNTER — Ambulatory Visit: Payer: 59 | Admitting: Physical Therapy

## 2018-01-19 ENCOUNTER — Encounter: Payer: Self-pay | Admitting: Physical Therapy

## 2018-01-19 DIAGNOSIS — M542 Cervicalgia: Secondary | ICD-10-CM

## 2018-01-19 DIAGNOSIS — R252 Cramp and spasm: Secondary | ICD-10-CM

## 2018-01-19 DIAGNOSIS — M545 Low back pain, unspecified: Secondary | ICD-10-CM

## 2018-01-19 NOTE — Therapy (Signed)
[place], [place], [address] Phone: [phone]   Fax:  [phone]  Physical Therapy Treatment  Patient Details  Name: Robin Delacruz MRN: [id number] Date of Birth: December 27, 1965 Referring Provider (PT): Chinita Pester   Encounter Date: 01/19/2018  PT End of Session - 01/19/18 1230    Visit Number  11    Date for PT Re-Evaluation  02/14/17    PT Start Time  5361    PT Stop Time  1244    PT Time Calculation (min)  49 min    Activity Tolerance  Patient tolerated treatment well;Patient limited by pain    Behavior During Therapy  [facility] for tasks assessed/performed       Past Medical History:  Diagnosis Date  . Allergy    seasonal  . Anemia   . Chest pain   . Diabetes mellitus    Diagnosed at age 47  . Dyspnea   . Fatigue   . Hypertension   . Ovarian cyst     Past Surgical History:  Procedure Laterality Date  . ABLATION    . CESAREAN SECTION    . TONSILLECTOMY    . TUBAL LIGATION      There were no vitals filed for this visit.  Subjective Assessment - 01/19/18 1202    Subjective  I am in pain, I got slammed at work. "My shoulder is great but my back hurts"    Currently in Pain?  Yes    Pain Score  8    it comes and goes    Pain Location  Shoulder    Pain Orientation  Left         [facility] PT Assessment - 01/19/18 0001      AROM   Overall AROM Comments  Cervical ROM decrease 25% with R rotation       Strength   Overall Strength Comments  shoulder flex is 4-/10  ER/IR WFL's                   [facility] Adult PT Treatment/Exercise - 01/19/18 0001      Shoulder Exercises: Seated   Other Seated Exercises  bent over 3lb rows, 3lb ext, and 3lb rev flys 2x10       Shoulder Exercises: Standing   External Rotation  Theraband;20 reps;Both    Theraband Level (Shoulder External Rotation)  Level 2 (Red)    Row  Theraband;20 reps;Both;Strengthening    Theraband Level  (Shoulder Row)  Level 2 (Red)    Other Standing Exercises  Bicept curls 7lv 2x15      Moist Heat Therapy   Number Minutes Moist Heat  15 Minutes    Moist Heat Location  Shoulder      Electrical Stimulation   Electrical Stimulation Location  Low back, L shoulder    Electrical Stimulation Action  IFC    Electrical Stimulation Parameters  supine    Electrical Stimulation Goals  Pain               PT Short Term Goals - 11/01/17 1111      PT SHORT TERM GOAL #1   Title  independent with initial HEP    Status  Achieved        PT Long Term Goals - 01/05/18 1126      PT LONG TERM GOAL #3   Title  decreas pain 50%    Status  Partially Met  PT LONG TERM GOAL #4   Title  increase cerviccal ROM 25%    Status  Achieved            Plan - 01/19/18 1231    Clinical Impression Statement  10 minutes late, Pt enters clinic reporting increase L shoulder and LBP. 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 Presents today's with decrease cervical ROM and well as some shoulder weakness. She did well with all the exercises but with a lower weight than normal. Modality to help with pain.    Rehab Potential  Good    PT Frequency  2x / week    PT Duration  8 weeks    PT Treatment/Interventions  ADLs/Self Care Home Management;Iontophoresis 19m/ml Dexamethasone;Cryotherapy;Electrical Stimulation;Ultrasound;Traction;Moist Heat;Therapeutic activities;Therapeutic exercise;Manual techniques;Patient/family education;Dry needling    PT Next Visit Plan  assess Tx. Scapular stabilization and strenghtneing possible DN       Patient will benefit from skilled therapeutic intervention in order to improve the following deficits and impairments:  Decreased range of motion, Difficulty walking, Increased muscle spasms, Increased fascial restricitons, Pain, Improper body mechanics, Impaired flexibility, Decreased mobility, Decreased strength, Postural dysfunction  Visit  Diagnosis: Cramp and spasm  Cervicalgia  Acute bilateral low back pain without sciatica     Problem List Patient Active Problem List   Diagnosis Date Noted  . Type 2 diabetes mellitus without complication ([place] 003/54/6568 . Ovarian cyst   . Anemia   . Dyspnea   . Fatigue   . Chest pain 11/02/2011  . Hypertension     RScot Jun PTA 01/19/2018, 12:33 PM  [place] [address]Phone: [phone]  Fax:  [phone] Name: Robin LoweMRN: [id number] of Birth: 2Feb 22, 1967

## 2018-01-25 ENCOUNTER — Other Ambulatory Visit: Payer: Self-pay | Admitting: Family Medicine

## 2018-01-25 DIAGNOSIS — I10 Essential (primary) hypertension: Secondary | ICD-10-CM

## 2018-01-25 DIAGNOSIS — E119 Type 2 diabetes mellitus without complications: Secondary | ICD-10-CM

## 2018-01-25 NOTE — Telephone Encounter (Signed)
Requested Prescriptions  Pending Prescriptions Disp Refills  . benazepril (LOTENSIN) 40 MG tablet [Pharmacy Med Name: BENAZEPRIL 40MG  TABLETS] 90 tablet 0    Sig: TAKE 1 TABLET BY MOUTH EVERY DAY     Cardiovascular:  ACE Inhibitors Passed - 01/25/2018  3:04 PM      Passed - Cr in normal range and within 180 days    Creat  Date Value Ref Range Status  05/14/2015 0.88 0.50 - 1.05 mg/dL Final   Creatinine, Ser  Date Value Ref Range Status  09/02/2017 0.91 0.57 - 1.00 mg/dL Final         Passed - K in normal range and within 180 days    Potassium  Date Value Ref Range Status  09/02/2017 4.1 3.5 - 5.2 mmol/L Final         Passed - Patient is not pregnant      Passed - Last BP in normal range    BP Readings from Last 1 Encounters:  10/14/17 137/76         Passed - Valid encounter within last 6 months    Recent Outpatient Visits          3 months ago Low back pain, unspecified back pain laterality, unspecified chronicity, with sciatica presence unspecified   [facility]   4 months ago Left cervical radiculopathy   [facility], [facility]   4 months ago Type 2 diabetes mellitus without complication, without long-term current use of insulin [facility](HCC)   [facility], [facility]   7 months ago Type 2 diabetes mellitus without complication, without long-term current use of insulin [facility](HCC)   [facility], [facility] R, [facility]   11 months ago Type 2 diabetes mellitus without complication, without long-term current use of insulin [facility](HCC)   [facility], [facility]

## 2018-02-04 ENCOUNTER — Ambulatory Visit: Payer: 59 | Admitting: Physical Therapy

## 2018-02-07 ENCOUNTER — Ambulatory Visit: Payer: 59 | Attending: Family Medicine | Admitting: Physical Therapy

## 2018-02-07 ENCOUNTER — Encounter: Payer: Self-pay | Admitting: Physical Therapy

## 2018-02-07 DIAGNOSIS — M542 Cervicalgia: Secondary | ICD-10-CM | POA: Insufficient documentation

## 2018-02-07 DIAGNOSIS — M545 Low back pain, unspecified: Secondary | ICD-10-CM

## 2018-02-07 DIAGNOSIS — R252 Cramp and spasm: Secondary | ICD-10-CM

## 2018-02-07 NOTE — Therapy (Signed)
[place], [place], [address] Phone: [phone]   Fax:  [phone]  Physical Therapy Treatment  Patient Details  Name: Rinda Rollyson MRN: [id number] Date of Birth: 1965-12-01 Referring Provider (PT): Chinita Pester   Encounter Date: 02/07/2018  PT End of Session - 02/07/18 0930    Visit Number  12    Date for PT Re-Evaluation  02/14/17    PT Start Time  0845    PT Stop Time  0930    PT Time Calculation (min)  45 min    Activity Tolerance  Patient tolerated treatment well    Behavior During Therapy  [facility] for tasks assessed/performed       Past Medical History:  Diagnosis Date  . Allergy    seasonal  . Anemia   . Chest pain   . Diabetes mellitus    Diagnosed at age 89  . Dyspnea   . Fatigue   . Hypertension   . Ovarian cyst     Past Surgical History:  Procedure Laterality Date  . ABLATION    . CESAREAN SECTION    . TONSILLECTOMY    . TUBAL LIGATION      There were no vitals filed for this visit.  Subjective Assessment - 02/07/18 0848    Subjective  "Back is good, my shoulder is al little stiff, cause I worked this whole weekend"    Currently in Pain?  No/denies    Pain Location  Shoulder    Pain Orientation  Left    Pain Descriptors / Indicators  Sore                       [facility] Adult PT Treatment/Exercise - 02/07/18 0001      Exercises   Exercises  Shoulder      Shoulder Exercises: Seated   Other Seated Exercises  bent over flys 4lb 2x10       Shoulder Exercises: Standing   Flexion  Strengthening;20 reps;Theraband;Left    Theraband Level (Shoulder Flexion)  Level 3 (Green)    ABduction  Weights;20 reps;Strengthening;Left    Theraband Level (Shoulder ABduction)  Level 3 (Green)    Extension  Strengthening;Weights;Both;15 reps   x2   Other Standing Exercises  Bicept curls 20lbv 2x10    Other Standing Exercises  Tricep 35lb 2x10       Shoulder  Exercises: ROM/Strengthening   UBE (Upper Arm Bike)  L4 3 min each way     Other ROM/Strengthening Exercises  Row 2x15 & lats 2x10 45lb      Ultrasound   Ultrasound Location  L upper trap rhomb    Ultrasound Parameters  1Mhz 1.3w/cm2     Ultrasound Goals  Pain               PT Short Term Goals - 11/01/17 1111      PT SHORT TERM GOAL #1   Title  independent with initial HEP    Status  Achieved        PT Long Term Goals - 01/05/18 1126      PT LONG TERM GOAL #3   Title  decreas pain 50%    Status  Partially Met      PT LONG TERM GOAL #4   Title  increase cerviccal ROM 25%    Status  Achieved            Plan - 02/07/18 0931  Clinical Impression Statement  Pt did well with today's activity despite reporting some shoulder soreness. Increase fatigue with L shoulder abduction and with bent over reverse fly's. 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     Rehab Potential  Good    PT Frequency  2x / week    PT Duration  8 weeks    PT Treatment/Interventions  ADLs/Self Care Home Management;Iontophoresis 54m/ml Dexamethasone;Cryotherapy;Electrical Stimulation;Ultrasound;Traction;Moist Heat;Therapeutic activities;Therapeutic exercise;Manual techniques;Patient/family education;Dry needling    PT Next Visit Plan  Scapular stabilization and strenghtneing possible DN       Patient will benefit from skilled therapeutic intervention in order to improve the following deficits and impairments:  Decreased range of motion, Difficulty walking, Increased muscle spasms, Increased fascial restricitons, Pain, Improper body mechanics, Impaired flexibility, Decreased mobility, Decreased strength, Postural dysfunction  Visit Diagnosis: Cervicalgia  Cramp and spasm  Acute bilateral low back pain without sciatica     Problem List Patient Active  Problem List   Diagnosis Date Noted  . Type 2 diabetes mellitus without complication (HB[place] 010/31/5945 . Ovarian cyst   . Anemia   . Dyspnea   . Fatigue   . Chest pain 11/02/2011  . Hypertension     RScot Jun PTA 02/07/2018, 9:38 AM  [place] [address]Phone: [phone]  Fax:  [phone] Name: EIya HamedMRN: [id number] of Birth: [birth year]

## 2018-02-15 DIAGNOSIS — J111 Influenza due to unidentified influenza virus with other respiratory manifestations: Secondary | ICD-10-CM | POA: Diagnosis not present

## 2018-02-15 DIAGNOSIS — R05 Cough: Secondary | ICD-10-CM | POA: Diagnosis not present

## 2018-02-16 ENCOUNTER — Ambulatory Visit: Payer: 59 | Admitting: Physical Therapy

## 2018-02-17 ENCOUNTER — Ambulatory Visit: Payer: 59 | Admitting: Physical Therapy

## 2018-02-17 ENCOUNTER — Encounter: Payer: Self-pay | Admitting: Physical Therapy

## 2018-02-17 DIAGNOSIS — M545 Low back pain, unspecified: Secondary | ICD-10-CM

## 2018-02-17 DIAGNOSIS — M542 Cervicalgia: Secondary | ICD-10-CM | POA: Diagnosis not present

## 2018-02-17 DIAGNOSIS — R252 Cramp and spasm: Secondary | ICD-10-CM

## 2018-02-17 NOTE — Therapy (Signed)
[place], [place], [address] Phone: [phone]   Fax:  [phone]  Physical Therapy Treatment  Patient Details  Name: Robin Delacruz MRN: [id number] Date of Birth: 03-15-1965 Referring Provider (PT): Chinita Pester   Encounter Date: 02/17/2018  PT End of Session - 02/17/18 1558    Visit Number  13    Date for PT Re-Evaluation  02/14/17    PT Start Time  8527    PT Stop Time  1600    PT Time Calculation (min)  43 min       Past Medical History:  Diagnosis Date  . Allergy    seasonal  . Anemia   . Chest pain   . Diabetes mellitus    Diagnosed at age 53  . Dyspnea   . Fatigue   . Hypertension   . Ovarian cyst     Past Surgical History:  Procedure Laterality Date  . ABLATION    . CESAREAN SECTION    . TONSILLECTOMY    . TUBAL LIGATION      There were no vitals filed for this visit.  Subjective Assessment - 02/17/18 1526    Subjective  "Getting better, it stronger"    Currently in Pain?  No/denies                       [facility] Adult PT Treatment/Exercise - 02/17/18 0001      Exercises   Exercises  Shoulder      Shoulder Exercises: Prone   Other Prone Exercises  Push ups on mat table x5       Shoulder Exercises: Standing   External Rotation  Theraband;Both;15 reps    Theraband Level (Shoulder External Rotation)  Level 3 (Green)    Internal Rotation  Left;Weights;15 reps   x2   Theraband Level (Shoulder Internal Rotation)  Level 3 (Green)    Flexion  Strengthening;20 reps;Theraband;Left    Shoulder Flexion Weight (lbs)  3 thne 5    ABduction  Weights;20 reps;Strengthening;Left    Shoulder ABduction Weight (lbs)  5lb     Other Standing Exercises  OHP 7lb 2x10, Flex/ abd combo 4lb 2x10       Shoulder Exercises: ROM/Strengthening   UBE (Upper Arm Bike)  L4.5 3 min each way     Other ROM/Strengthening Exercises  Rows 55lb 2x10       Ultrasound   Ultrasound Location  L upper trap to rhomboids    Ultrasound Parameters  1Mhz n1.3w/cm2    Ultrasound Goals  Pain               PT Short Term Goals - 11/01/17 1111      PT SHORT TERM GOAL #1   Title  independent with initial HEP    Status  Achieved        PT Long Term Goals - 01/05/18 1126      PT LONG TERM GOAL #3   Title  decreas pain 50%    Status  Partially Met      PT LONG TERM GOAL #4   Title  increase cerviccal ROM 25%    Status  Achieved            Plan - 02/17/18 1600    Clinical Impression Statement  Pt stated that she is doing well with increase shoulder strength and decrease tightness. Increase strength tolerated on seated Rows. Cues needed to maintain  scapula stability with seated rows. Did well with increase load     Rehab Potential  Good    PT Frequency  2x / week    PT Duration  8 weeks    PT Treatment/Interventions  ADLs/Self Care Home Management;Iontophoresis 38m/ml Dexamethasone;Cryotherapy;Electrical Stimulation;Ultrasound;Traction;Moist Heat;Therapeutic activities;Therapeutic exercise;Manual techniques;Patient/family education;Dry needling    PT Next Visit Plan  D/C next visit       Patient will benefit from skilled therapeutic intervention in order to improve the following deficits and impairments:  Decreased range of motion, Difficulty walking, Increased muscle spasms, Increased fascial restricitons, Pain, Improper body mechanics, Impaired flexibility, Decreased mobility, Decreased strength, Postural dysfunction  Visit Diagnosis: Cervicalgia  Cramp and spasm  Acute bilateral low back pain without sciatica     Problem List Patient Active Problem List   Diagnosis Date Noted  . Type 2 diabetes mellitus without complication (HFl[place] 010/62/6948 . Ovarian cyst   . Anemia   . Dyspnea   . Fatigue   . Chest pain 11/02/2011  . Hypertension     RScot Jun PTA 02/17/2018, 4:06 PM  [place] [address]Phone: [phone]  Fax:  [phone] Name: EKashauna CelmerMRN: [id number] of Birth: [birth year]

## 2018-02-21 ENCOUNTER — Encounter: Payer: Self-pay | Admitting: Physical Therapy

## 2018-02-21 ENCOUNTER — Ambulatory Visit: Payer: 59 | Admitting: Physical Therapy

## 2018-02-21 DIAGNOSIS — M545 Low back pain, unspecified: Secondary | ICD-10-CM

## 2018-02-21 DIAGNOSIS — M542 Cervicalgia: Secondary | ICD-10-CM

## 2018-02-21 DIAGNOSIS — R252 Cramp and spasm: Secondary | ICD-10-CM

## 2018-02-21 NOTE — Therapy (Signed)
[place], [place], [address] Phone: [phone]   Fax:  [phone]  Physical Therapy Treatment  Patient Details  Name: Robin Delacruz MRN: [id number] Date of Birth: Apr 03, 1965 Referring Provider (PT): Chinita Pester   Encounter Date: 02/21/2018  PT End of Session - 02/21/18 1342    Visit Number  14    Date for PT Re-Evaluation  02/14/17    PT Start Time  1300    PT Stop Time  1342    PT Time Calculation (min)  42 min    Activity Tolerance  Patient tolerated treatment well    Behavior During Therapy  [facility] for tasks assessed/performed       Past Medical History:  Diagnosis Date  . Allergy    seasonal  . Anemia   . Chest pain   . Diabetes mellitus    Diagnosed at age 54  . Dyspnea   . Fatigue   . Hypertension   . Ovarian cyst     Past Surgical History:  Procedure Laterality Date  . ABLATION    . CESAREAN SECTION    . TONSILLECTOMY    . TUBAL LIGATION      There were no vitals filed for this visit.  Subjective Assessment - 02/21/18 1305    Subjective  "A little sore"    Currently in Pain?  No/denies    Pain Score  0-No pain                       [facility] Adult PT Treatment/Exercise - 02/21/18 0001      Exercises   Exercises  Shoulder      Shoulder Exercises: Standing   Horizontal ABduction  20 reps;Theraband;Both    Theraband Level (Shoulder Horizontal ABduction)  Level 3 (Green)    External Rotation  Theraband;Both;15 reps    Theraband Level (Shoulder External Rotation)  Level 3 (Green)    Extension  [employer] reps;Both    Extension Weight (lbs)  10    Other Standing Exercises  OHP 7lb 2x10, Flex/ abd combo 4lb 2x10     Other Standing Exercises  Tricep 35lb 2x10       Shoulder Exercises: ROM/Strengthening   UBE (Upper Arm Bike)  L4.5 3 min each way     Other ROM/Strengthening Exercises  serratus presses 15lb 2x10    Other ROM/Strengthening  Exercises  Rows 55lb 2x10; Lat pull down 45lb 2x10       Ultrasound   Ultrasound Location  L uper Trap     Ultrasound Parameters  1Mhz 1.3w/cm2     Ultrasound Goals  Pain               PT Short Term Goals - 11/01/17 1111      PT SHORT TERM GOAL #1   Title  independent with initial HEP    Status  Achieved        PT Long Term Goals - 02/21/18 1345      PT LONG TERM GOAL #1   Title  understand proper posture and body mechanics    Status  Achieved      PT LONG TERM GOAL #2   Title  indepednent and safe with gym exercises    Status  Achieved      PT LONG TERM GOAL #3   Title  decreas pain 50%    Status  Achieved      PT  LONG TERM GOAL #4   Title  increase cerviccal ROM 25%    Status  Achieved            Plan - 02/21/18 1343    Clinical Impression Statement  All goals met. Pt did well with today's activities. She has progressed overall and is pleased with her current functional level. 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     PT Frequency  2x / week    PT Duration  8 weeks    PT Treatment/Interventions  ADLs/Self Care Home Management;Iontophoresis 75m/ml Dexamethasone;Cryotherapy;Electrical Stimulation;Ultrasound;Traction;Moist Heat;Therapeutic activities;Therapeutic exercise;Manual techniques;Patient/family education;Dry needling    PT Next Visit Plan  D/C Pt       Patient will benefit from skilled therapeutic intervention in order to improve the following deficits and impairments:  Decreased range of motion, Difficulty walking, Increased muscle spasms, Increased fascial restricitons, Pain, Improper body mechanics, Impaired flexibility, Decreased mobility, Decreased strength, Postural dysfunction  Visit Diagnosis: Cervicalgia  Cramp and spasm  Acute bilateral low back pain without sciatica     Problem List Patient Active Problem List   Diagnosis Date Noted  . Type 2 diabetes mellitus  without complication ([place] 000/93/8182 . Ovarian cyst   . Anemia   . Dyspnea   . Fatigue   . Chest pain 11/02/2011  . Hypertension    PHYSICAL THERAPY DISCHARGE SUMMARY  Visits from Start of Care: 14 Plan: Patient agrees to discharge.  Patient goals were met. Patient is being discharged due to meeting the stated rehab goals.  ?????       RScot Jun PTA 02/21/2018, 1:45 PM  [place] [address]Phone: [phone]  Fax:  [phone] Name: EAlisha BacusMRN: [id number] of Birth: [birth year]

## 2018-02-28 ENCOUNTER — Other Ambulatory Visit: Payer: Self-pay | Admitting: Family Medicine

## 2018-02-28 DIAGNOSIS — E119 Type 2 diabetes mellitus without complications: Secondary | ICD-10-CM

## 2018-04-27 ENCOUNTER — Other Ambulatory Visit: Payer: Self-pay | Admitting: Family Medicine

## 2018-04-27 DIAGNOSIS — E119 Type 2 diabetes mellitus without complications: Secondary | ICD-10-CM

## 2018-04-27 DIAGNOSIS — I10 Essential (primary) hypertension: Secondary | ICD-10-CM

## 2018-04-27 NOTE — Telephone Encounter (Signed)
30 day courtesy refill given. Patient needs appt for further refills

## 2018-06-05 ENCOUNTER — Other Ambulatory Visit: Payer: Self-pay | Admitting: Family Medicine

## 2018-06-05 DIAGNOSIS — E119 Type 2 diabetes mellitus without complications: Secondary | ICD-10-CM

## 2018-06-05 DIAGNOSIS — I10 Essential (primary) hypertension: Secondary | ICD-10-CM

## 2018-06-17 ENCOUNTER — Telehealth (INDEPENDENT_AMBULATORY_CARE_PROVIDER_SITE_OTHER): Payer: 59 | Admitting: Family Medicine

## 2018-06-17 ENCOUNTER — Other Ambulatory Visit: Payer: Self-pay

## 2018-06-17 ENCOUNTER — Telehealth: Payer: Self-pay | Admitting: Family Medicine

## 2018-06-17 DIAGNOSIS — J309 Allergic rhinitis, unspecified: Secondary | ICD-10-CM | POA: Diagnosis not present

## 2018-06-17 DIAGNOSIS — E119 Type 2 diabetes mellitus without complications: Secondary | ICD-10-CM

## 2018-06-17 DIAGNOSIS — I10 Essential (primary) hypertension: Secondary | ICD-10-CM

## 2018-06-17 DIAGNOSIS — E785 Hyperlipidemia, unspecified: Secondary | ICD-10-CM | POA: Diagnosis not present

## 2018-06-17 MED ORDER — BENAZEPRIL HCL 40 MG PO TABS: 40.0000 mg | ORAL_TABLET | Freq: Every day | ORAL | 2 refills | Status: DC

## 2018-06-17 MED ORDER — AMLODIPINE BESYLATE 5 MG PO TABS: 5.0000 mg | ORAL_TABLET | Freq: Every day | ORAL | 2 refills | Status: DC

## 2018-06-17 MED ORDER — ATORVASTATIN CALCIUM 20 MG PO TABS: 20.0000 mg | ORAL_TABLET | Freq: Every day | ORAL | 2 refills | Status: DC

## 2018-06-17 MED ORDER — METFORMIN HCL 500 MG PO TABS: 500.0000 mg | ORAL_TABLET | Freq: Two times a day (BID) | ORAL | 2 refills | Status: DC

## 2018-06-17 NOTE — Progress Notes (Signed)
Med refills. All meds. Chronic condition follow up.

## 2018-06-17 NOTE — Telephone Encounter (Signed)
Copied from CRM [id number]. Topic: Quick Communication - Rx Refill/Question >> Jun 17, 2018  3:02 PM [name] wrote: Medication: benazepril (LOTENSIN) 40 MG tablet  - pt called pharmacy and they have not received RX. Pt is currently out of medication as discussed during Virtual Visit today with Dr. [name].   Has the patient contacted their pharmacy? Yes Preferred Pharmacy (with phone number or street name): [id number] - [place], [place] - [address] AT [facility] [address] [phone] (Phone) [phone] (Fax)

## 2018-06-17 NOTE — Addendum Note (Signed)
Addended by: Meredith Staggers R on: 06/17/2018 03:18 PM   Modules accepted: Orders

## 2018-06-17 NOTE — Patient Instructions (Addendum)
I am glad to hear the back and neck symptoms have improved.  Keep up with some of the exercises recommended by physical therapy.  If you do wake up hungry, check blood sugar to make sure it is not dropping.  Depending on the A1c reading can discuss change in regimen.  If A1c is controlled, follow-up in 6 months, otherwise 3 months.  For allergies, can add Flonase over-the-counter 1 to 2 sprays per nostril each day.  Use the technique we discussed and try to avoid sniffing that medication after use.  If still no relief with allergies, please schedule another appointment we can discuss other approaches.  No other change in medications, but if blood pressures remain over 140/90 at work, let me know as we may need to adjust regimen.  See other information below to make sure blood pressure is being obtained accurately.  Let me know if there are other questions, take care.   Allergic Rhinitis, Adult Allergic rhinitis is an allergic reaction that affects the mucous membrane inside the nose. It causes sneezing, a runny or stuffy nose, and the feeling of mucus going down the back of the throat (postnasal drip). Allergic rhinitis can be mild to severe. There are two types of allergic rhinitis:  Seasonal. This type is also called hay fever. It happens only during certain seasons.  Perennial. This type can happen at any time of the year. What are the causes? This condition happens when the body's defense system (immune system) responds to certain harmless substances called allergens as though they were germs.  Seasonal allergic rhinitis is triggered by pollen, which can come from grasses, trees, and weeds. Perennial allergic rhinitis may be caused by:  House dust mites.  Pet dander.  Mold spores. What are the signs or symptoms? Symptoms of this condition include:  Sneezing.  Runny or stuffy nose (nasal congestion).  Postnasal drip.  Itchy nose.  Tearing of the eyes.  Trouble  sleeping.  Daytime sleepiness. How is this diagnosed? This condition may be diagnosed based on:  Your medical history.  A physical exam.  Tests to check for related conditions, such as: ? Asthma. ? Pink eye. ? Ear infection. ? Upper respiratory infection.  Tests to find out which allergens trigger your symptoms. These may include skin or blood tests. How is this treated? There is no cure for this condition, but treatment can help control symptoms. Treatment may include:  Taking medicines that block allergy symptoms, such as antihistamines. Medicine may be given as a shot, nasal spray, or pill.  Avoiding the allergen.  Desensitization. This treatment involves getting ongoing shots until your body becomes less sensitive to the allergen. This treatment may be done if other treatments do not help.  If taking medicine and avoiding the allergen does not work, new, stronger medicines may be prescribed. Follow these instructions at home:  Find out what you are allergic to. Common allergens include smoke, dust, and pollen.  Avoid the things you are allergic to. These are some things you can do to help avoid allergens: ? Replace carpet with wood, tile, or vinyl flooring. Carpet can trap dander and dust. ? Do not smoke. Do not allow smoking in your home. ? Change your heating and air conditioning filter at least once a month. ? During allergy season:  Keep windows closed as much as possible.  Plan outdoor activities when pollen counts are lowest. This is usually during the evening hours.  When coming indoors, change clothing and shower  before sitting on furniture or bedding.  Take over-the-counter and prescription medicines only as told by your health care provider.  Keep all follow-up visits as told by your health care provider. This is important. Contact a health care provider if:  You have a fever.  You develop a persistent cough.  You make whistling sounds when you  breathe (you wheeze).  Your symptoms interfere with your normal daily activities. Get help right away if:  You have shortness of breath. Summary  This condition can be managed by taking medicines as directed and avoiding allergens.  Contact your health care provider if you develop a persistent cough or fever.  During allergy season, keep windows closed as much as possible. This information is not intended to replace advice given to you by your health care provider. Make sure you discuss any questions you have with your health care provider. Document Released: 10/14/2000 Document Revised: 02/27/2016 Document Reviewed: 02/27/2016 Elsevier Interactive Patient Education  2019 [employer].  How to Take Your Blood Pressure You can take your blood pressure at home with a machine. You may need to check your blood pressure at home:  To check if you have high blood pressure (hypertension).  To check your blood pressure over time.  To make sure your blood pressure medicine is working. Supplies needed: You will need a blood pressure machine, or monitor. You can buy one at a drugstore or online. When choosing one:  Choose one with an arm cuff.  Choose one that wraps around your upper arm. Only one finger should fit between your arm and the cuff.  Do not choose one that measures your blood pressure from your wrist or finger. Your doctor can suggest a monitor. How to prepare Avoid these things for 30 minutes before checking your blood pressure:  Drinking caffeine.  Drinking alcohol.  Eating.  Smoking.  Exercising. Five minutes before checking your blood pressure:  Pee.  Sit in a dining chair. Avoid sitting in a soft couch or armchair.  Be quiet. Do not talk. How to take your blood pressure Follow the instructions that came with your machine. If you have a digital blood pressure monitor, these may be the instructions: 1. Sit up straight. 2. Place your feet on the floor. Do  not cross your ankles or legs. 3. Rest your left arm at the level of your heart. You may rest it on a table, desk, or chair. 4. Pull up your shirt sleeve. 5. Wrap the blood pressure cuff around the upper part of your left arm. The cuff should be 1 inch (2.5 cm) above your elbow. It is best to wrap the cuff around bare skin. 6. Fit the cuff snugly around your arm. You should be able to place only one finger between the cuff and your arm. 7. Put the cord inside the groove of your elbow. 8. Press the power button. 9. Sit quietly while the cuff fills with air and loses air. 10. Write down the numbers on the screen. 11. Wait 2-3 minutes and then repeat steps 1-10. What do the numbers mean? Two numbers make up your blood pressure. The first number is called systolic pressure. The second is called diastolic pressure. An example of a blood pressure reading is "120 over 80" (or 120/80). If you are an adult and do not have a medical condition, use this guide to find out if your blood pressure is normal: Normal  First number: below 120.  Second number: below 80. Elevated  First number: 120-129.  Second number: below 80. Hypertension stage 1  First number: 130-139.  Second number: 80-89. Hypertension stage 2  First number: 140 or above.  Second number: 90 or above. Your blood pressure is above normal even if only the top or bottom number is above normal. Follow these instructions at home:  Check your blood pressure as often as your doctor tells you to.  Take your monitor to your next doctor's appointment. Your doctor will: ? Make sure you are using it correctly. ? Make sure it is working right.  Make sure you understand what your blood pressure numbers should be.  Tell your doctor if your medicines are causing side effects. Contact a doctor if:  Your blood pressure keeps being high. Get help right away if:  Your first blood pressure number is higher than 180.  Your second  blood pressure number is higher than 120. This information is not intended to replace advice given to you by your health care provider. Make sure you discuss any questions you have with your health care provider. Document Released: 01/02/2008 Document Revised: 12/18/2015 Document Reviewed: 06/28/2015 Elsevier Interactive Patient Education  [employer]2019 Elsevier Inc.    If you have lab work done today you will be contacted with your lab results within the next 2 weeks.  If you have not heard from us then please contact us. The fastest way to get your results is to register for My Chart.   IF you received an x-ray today, you will receive an invoice from [facility]. Please contact [facility] at [phone] with questions or concerns regarding your invoice.   IF you received labwork today, you will receive an invoice from [facility]. Please contact [facility] at [phone] with questions or concerns regarding your invoice.   Our billing staff will not be able to assist you with questions regarding bills from these companies.  You will be contacted with the lab results as soon as they are available. The fastest way to get your results is to activate your My Chart account. Instructions are located on the last page of this paperwork. If you have not heard from us regarding the results in 2 weeks, please contact this office.

## 2018-06-17 NOTE — Telephone Encounter (Signed)
LVM

## 2018-06-17 NOTE — Progress Notes (Signed)
Virtual Visit via Telephone Note  I connected with [name] on 06/17/18 at 10:09 AM by telephone and verified that I am speaking with the correct person using two identifiers.   I discussed the limitations, risks, security and privacy concerns of performing an evaluation and management service by telephone and the availability of in person appointments. I also discussed with the patient that there may be a patient responsible charge related to this service. The patient expressed understanding and agreed to proceed, consent obtained  Chief complaint: Med refills, chronic condition follow up.   History of Present Illness: Robin Delacruz is a 53 y.o. female  Has been busy - working throughout the pandemic. Wearing mask, gloves.   History of low back pain, cervical radiculopathy, was improving with physical therapy when discussed in September 2019 and continue Flexeril as needed at that time.  Did recommend 6-week follow-up to decide if orthopedic eval needed.  Last PT appointment January 20.  Goals were met at that time and discharged.  Total of 14 visits. Doing ok. Pain free.  Diabetes: Treated with metformin 500 mg twice daily She is on ACE inhibitor with Lotensin, statin with Lipitor. Microalbumin: Normal at 25 in April 2019. Home readings:112-117.  Hungry at night at times, has not checked glucose.  Eating more salads - healthier. Lighter meals and more consistent meals.  Optho, foot exam, pneumovax: Up-to-date on foot exam and Pneumovax. Ophthalmology: [facility] appt 11/2017.    Lab Results  Component Value Date   HGBA1C 7.4 (H) 09/02/2017   HGBA1C 6.9 (H) 06/01/2017   HGBA1C 8.0 (H) 02/26/2017   Lab Results  Component Value Date   MICROALBUR 1.1 08/04/2014   LDLCALC 58 09/02/2017   CREATININE 0.91 09/02/2017  lipitor 3m qd. No new myalgias or side effects.   Hypertension: BP Readings from Last 3 Encounters:  10/14/17 137/76   09/16/17 132/78  09/02/17 138/80   Lab Results  Component Value Date   CREATININE 0.91 09/02/2017  Takes Lotensin 40 mg daily, amlodipine 5 mg daily. Home readings and at work: up to 150/90's at end of day, home usually 128/70. Out of meds 3 days- 163/93 Constitutional: negative for unexpected weight change. Some fatigue with increased work.  Eyes: Negative for visual disturbance.  Respiratory: Negative for cough, chest tightness and shortness of breath.   Cardiovascular: Negative for chest pain, palpitations and leg swelling.  Gastrointestinal: Negative for abdominal pain and blood in stool.  Neurological: Negative for dizziness, light-headedness and headaches.   Some allergy symptoms - zyrtec and eye drops. No current nasal spray.   Health maintenance: Due for mammogram: plans to schedule   Patient Active Problem List   Diagnosis Date Noted  . Type 2 diabetes mellitus without complication ([place] 027/08/8673 . Ovarian cyst   . Anemia   . Dyspnea   . Fatigue   . Chest pain 11/02/2011  . Hypertension    Past Medical History:  Diagnosis Date  . Allergy    seasonal  . Anemia   . Chest pain   . Diabetes mellitus    Diagnosed at age 53 . Dyspnea   . Fatigue   . Hypertension   . Ovarian cyst    Past Surgical History:  Procedure Laterality Date  . ABLATION    . CESAREAN SECTION    . TONSILLECTOMY    . TUBAL LIGATION     Allergies  Allergen Reactions  . Artichoke [Cynara Scolymus (Artichoke)] Hives  .  Codeine Anaphylaxis and Hives  . Penicillins Anaphylaxis and Hives  . Sulfa Antibiotics Anaphylaxis and Hives   Prior to Admission medications   Medication Sig Start Date End Date Taking? Authorizing Provider  albuterol (PROVENTIL HFA;VENTOLIN HFA) 108 (90 Base) MCG/ACT inhaler Inhale 1-2 puffs into the lungs every 4 (four) hours as needed for wheezing or shortness of breath (cough, shortness of breath or wheezing.). 06/05/16  Yes Wendie Agreste, MD  amLODipine  (NORVASC) 5 MG tablet TAKE 1 TABLET BY MOUTH EVERY DAY 12/23/17  Yes Wendie Agreste, MD  aspirin EC 81 MG tablet Take 1 tablet (81 mg total) by mouth daily. 11/02/11  Yes Lelon Perla, MD  atorvastatin (LIPITOR) 20 MG tablet TAKE 1 TABLET BY MOUTH EVERY DAY 12/23/17  Yes Wendie Agreste, MD  benazepril (LOTENSIN) 40 MG tablet TAKE 1 TABLET BY MOUTH EVERY DAY 04/27/18  Yes Wendie Agreste, MD  cyclobenzaprine (FLEXERIL) 5 MG tablet Take 1 tablet (5 mg total) by mouth 3 (three) times daily as needed for muscle spasms (start qhs prn due to sedation). 09/16/17  Yes Wendie Agreste, MD  metFORMIN (GLUCOPHAGE) 500 MG tablet TAKE 1 TABLET BY MOUTH TWICE DAILY WITH A MEAL 03/01/18  Yes Wendie Agreste, MD   Social History   Socioeconomic History  . Marital status: Divorced    Spouse name: Not on file  . Number of children: 2  . Years of education: Not on file  . Highest education level: Not on file  Occupational History  . Occupation: MENTAL HEALTH    Employer: [employer]  Social Needs  . Financial resource strain: Not on file  . Food insecurity:    Worry: Not on file    Inability: Not on file  . Transportation needs:    Medical: Not on file    Non-medical: Not on file  Tobacco Use  . Smoking status: Never Smoker  . Smokeless tobacco: Never Used  Substance and Sexual Activity  . Alcohol use: No    Alcohol/week: 0.0 standard drinks  . Drug use: No  . Sexual activity: Never  Lifestyle  . Physical activity:    Days per week: Not on file    Minutes per session: Not on file  . Stress: Not on file  Relationships  . Social connections:    Talks on phone: Not on file    Gets together: Not on file    Attends religious service: Not on file    Active member of club or organization: Not on file    Attends meetings of clubs or organizations: Not on file    Relationship status: Not on file  . Intimate partner violence:    Fear of current or ex partner: Not on file     Emotionally abused: Not on file    Physically abused: Not on file    Forced sexual activity: Not on file  Other Topics Concern  . Not on file  Social History Narrative  . Not on file     Observations/Objective: No distress, normal speech, normal responses.  Assessment and Plan: Type 2 diabetes mellitus without complication, without long-term current use of insulin (HCC) - Plan: Hemoglobin A1c, Microalbumin / creatinine urine ratio  -Continue metformin same dose, check A1c at lab visit.  3 to 68-monthfollow-up.  If low blood sugars in the middle the night, would decrease metformin to once per day as long as A1c has improved.  Essential hypertension - Plan: Comprehensive  metabolic panel  -Home readings okay.  Question reliability of work readings, handout given on appropriate blood pressure measurement.  If persistent elevated readings can check in office to determine changes.  Hyperlipidemia, unspecified hyperlipidemia type - Plan: Lipid panel  -Tolerating statin, continue same, labs pending  Allergic rhinitis, unspecified seasonality, unspecified trigger  -Okay to continue Zyrtec, option of addition of Flonase with appropriate use discussed.  RTC precautions  Follow Up Instructions:  3 months unless A1c controlled - then 6 months.    I discussed the assessment and treatment plan with the patient. The patient was provided an opportunity to ask questions and all were answered. The patient agreed with the plan and demonstrated an understanding of the instructions.   The patient was advised to call back or seek an in-person evaluation if the symptoms worsen or if the condition fails to improve as anticipated.  I provided 11 minutes of non-face-to-face time during this encounter.  Signed,   Merri Ray, MD [facility].  06/17/18

## 2018-06-23 ENCOUNTER — Ambulatory Visit: Payer: 59

## 2018-06-23 ENCOUNTER — Other Ambulatory Visit: Payer: Self-pay

## 2018-06-23 DIAGNOSIS — E785 Hyperlipidemia, unspecified: Secondary | ICD-10-CM

## 2018-06-23 DIAGNOSIS — I10 Essential (primary) hypertension: Secondary | ICD-10-CM

## 2018-06-23 DIAGNOSIS — E119 Type 2 diabetes mellitus without complications: Secondary | ICD-10-CM

## 2018-06-24 LAB — HEMOGLOBIN A1C
Est. average glucose Bld gHb Est-mCnc: 169 mg/dL
Hgb A1c MFr Bld: 7.5 % — ABNORMAL HIGH (ref 4.8–5.6)

## 2018-06-24 LAB — COMPREHENSIVE METABOLIC PANEL
ALT: 16 IU/L (ref 0–32)
AST: 18 IU/L (ref 0–40)
Albumin/Globulin Ratio: 1.6 (ref 1.2–2.2)
Albumin: 4.2 g/dL (ref 3.8–4.9)
Alkaline Phosphatase: 78 IU/L (ref 39–117)
BUN/Creatinine Ratio: 21 (ref 9–23)
BUN: 22 mg/dL (ref 6–24)
Bilirubin Total: 0.4 mg/dL (ref 0.0–1.2)
CO2: 26 mmol/L (ref 20–29)
Calcium: 9.5 mg/dL (ref 8.7–10.2)
Chloride: 102 mmol/L (ref 96–106)
Creatinine, Ser: 1.06 mg/dL — ABNORMAL HIGH (ref 0.57–1.00)
GFR calc Af Amer: 69 mL/min/{1.73_m2} (ref >59)
GFR calc non Af Amer: 60 mL/min/{1.73_m2} (ref >59)
Globulin, Total: 2.6 g/dL (ref 1.5–4.5)
Glucose: 133 mg/dL — ABNORMAL HIGH (ref 65–99)
Potassium: 4.4 mmol/L (ref 3.5–5.2)
Sodium: 142 mmol/L (ref 134–144)
Total Protein: 6.8 g/dL (ref 6.0–8.5)

## 2018-06-24 LAB — LIPID PANEL
Chol/HDL Ratio: 2.9 ratio (ref 0.0–4.4)
Cholesterol, Total: 116 mg/dL (ref 100–199)
HDL: 40 mg/dL (ref >39)
LDL Calculated: 52 mg/dL (ref 0–99)
Triglycerides: 118 mg/dL (ref 0–149)
VLDL Cholesterol Cal: 24 mg/dL (ref 5–40)

## 2018-06-24 LAB — MICROALBUMIN / CREATININE URINE RATIO
Creatinine, Urine: 123.6 mg/dL
Microalb/Creat Ratio: 4 mg/g creat (ref 0–29)
Microalbumin, Urine: 5.4 ug/mL

## 2018-09-15 ENCOUNTER — Ambulatory Visit: Payer: 59 | Admitting: Family Medicine

## 2018-09-16 ENCOUNTER — Encounter: Payer: Self-pay | Admitting: Family Medicine

## 2019-03-16 ENCOUNTER — Other Ambulatory Visit: Payer: Self-pay

## 2019-03-16 ENCOUNTER — Ambulatory Visit (INDEPENDENT_AMBULATORY_CARE_PROVIDER_SITE_OTHER): Payer: 59 | Admitting: Family Medicine

## 2019-03-16 VITALS — BP 144/88 | HR 72 | Temp 98.0°F | Ht 66.0 in | Wt 196.0 lb

## 2019-03-16 DIAGNOSIS — E785 Hyperlipidemia, unspecified: Secondary | ICD-10-CM

## 2019-03-16 DIAGNOSIS — R5383 Other fatigue: Secondary | ICD-10-CM

## 2019-03-16 DIAGNOSIS — E1165 Type 2 diabetes mellitus with hyperglycemia: Secondary | ICD-10-CM | POA: Diagnosis not present

## 2019-03-16 DIAGNOSIS — Z0000 Encounter for general adult medical examination without abnormal findings: Secondary | ICD-10-CM

## 2019-03-16 DIAGNOSIS — G4700 Insomnia, unspecified: Secondary | ICD-10-CM | POA: Diagnosis not present

## 2019-03-16 DIAGNOSIS — M545 Low back pain, unspecified: Secondary | ICD-10-CM

## 2019-03-16 DIAGNOSIS — N951 Menopausal and female climacteric states: Secondary | ICD-10-CM

## 2019-03-16 DIAGNOSIS — Z0001 Encounter for general adult medical examination with abnormal findings: Secondary | ICD-10-CM

## 2019-03-16 DIAGNOSIS — E119 Type 2 diabetes mellitus without complications: Secondary | ICD-10-CM

## 2019-03-16 DIAGNOSIS — Z23 Encounter for immunization: Secondary | ICD-10-CM

## 2019-03-16 DIAGNOSIS — I10 Essential (primary) hypertension: Secondary | ICD-10-CM

## 2019-03-16 LAB — POCT GLYCOSYLATED HEMOGLOBIN (HGB A1C): Hemoglobin A1C: 8.7 % — AB (ref 4.0–5.6)

## 2019-03-16 LAB — GLUCOSE, POCT (MANUAL RESULT ENTRY): POC Glucose: 184 mg/dl — AB (ref 70–99)

## 2019-03-16 MED ORDER — AMLODIPINE BESYLATE 5 MG PO TABS: 5.0000 mg | ORAL_TABLET | Freq: Every day | ORAL | 2 refills | Status: DC

## 2019-03-16 MED ORDER — ATORVASTATIN CALCIUM 20 MG PO TABS: 20.0000 mg | ORAL_TABLET | Freq: Every day | ORAL | 2 refills | Status: DC

## 2019-03-16 MED ORDER — BENAZEPRIL HCL 40 MG PO TABS: 40.0000 mg | ORAL_TABLET | Freq: Every day | ORAL | 2 refills | Status: DC

## 2019-03-16 MED ORDER — SHINGRIX 50 MCG/0.5ML IM SUSR: 0.5000 mL | Freq: Once | INTRAMUSCULAR | 1 refills | Status: AC

## 2019-03-16 MED ORDER — METFORMIN HCL 500 MG PO TABS: ORAL_TABLET | ORAL | 2 refills | Status: DC

## 2019-03-16 NOTE — Patient Instructions (Addendum)
See handout on menopausal symptoms - will discuss this further next visit.  For sleep - try melatonin at night. See other information on stress and insomnia. continue peloton/exercise.  Follow up in 2 weeks to discuss further.   See info on back pain below - will discuss this further at next visit as well.   Return to the clinic or go to the nearest emergency room if any of your symptoms worsen or new symptoms occur.  COVID-19 Vaccine Information can be found at: [email or web address] For questions related to vaccine distribution or appointments, please email [email or web address] or call [phone].   Based on elevated readings, can increase amlodipine to 2 pills/day if home readings over 130/80.  Watch for low blood pressures, lightheadedness, or dizziness.  Will recheck blood pressure next visit as well.  Bring your meter with you to that visit.   Blood sugar elevated, try increasing Metformin to 2 pills in the morning, 1 at night and then ultimately 2 pills twice per day for improved readings may be needed. I anticipate improved diet and exercise will also help those readings.  Can discuss this further at follow-up as well.  Thank you for coming in today   Acute Back Pain, Adult Acute back pain is sudden and usually short-lived. It is often caused by an injury to the muscles and tissues in the back. The injury may result from:  A muscle or ligament getting overstretched or torn (strained). Ligaments are tissues that connect bones to each other. Lifting something improperly can cause a back strain.  Wear and tear (degeneration) of the spinal disks. Spinal disks are circular tissue that provides cushioning between the bones of the spine (vertebrae).  Twisting motions, such as while playing sports or doing yard work.  A hit to the back.  Arthritis. You may have a physical exam, lab tests, and imaging tests to find the cause of  your pain. Acute back pain usually goes away with rest and home care. Follow these instructions at home: Managing pain, stiffness, and swelling  Take over-the-counter and prescription medicines only as told by your health care provider.  Your health care provider may recommend applying ice during the first 24-48 hours after your pain starts. To do this: ? Put ice in a plastic bag. ? Place a towel between your skin and the bag. ? Leave the ice on for 20 minutes, 2-3 times a day.  If directed, apply heat to the affected area as often as told by your health care provider. Use the heat source that your health care provider recommends, such as a moist heat pack or a heating pad. ? Place a towel between your skin and the heat source. ? Leave the heat on for 20-30 minutes. ? Remove the heat if your skin turns bright red. This is especially important if you are unable to feel pain, heat, or cold. You have a greater risk of getting burned. Activity   Do not stay in bed. Staying in bed for more than 1-2 days can delay your recovery.  Sit up and stand up straight. Avoid leaning forward when you sit, or hunching over when you stand. ? If you work at a desk, sit close to it so you do not need to lean over. Keep your chin tucked in. Keep your neck drawn back, and keep your elbows bent at a right angle. Your arms should look like the letter "L." ? Sit high and close to the steering wheel when you drive. Add  lower back (lumbar) support to your car seat, if needed.  Take short walks on even surfaces as soon as you are able. Try to increase the length of time you walk each day.  Do not sit, drive, or stand in one place for more than 30 minutes at a time. Sitting or standing for long periods of time can put stress on your back.  Do not drive or use heavy machinery while taking prescription pain medicine.  Use proper lifting techniques. When you bend and lift, use positions that put less stress on your  back: ? [place] your knees. ? Keep the load close to your body. ? Avoid twisting.  Exercise regularly as told by your health care provider. Exercising helps your back heal faster and helps prevent back injuries by keeping muscles strong and flexible.  Work with a physical therapist to make a safe exercise program, as recommended by your health care provider. Do any exercises as told by your physical therapist. Lifestyle  Maintain a healthy weight. Extra weight puts stress on your back and makes it difficult to have good posture.  Avoid activities or situations that make you feel anxious or stressed. Stress and anxiety increase muscle tension and can make back pain worse. Learn ways to manage anxiety and stress, such as through exercise. General instructions  Sleep on a firm mattress in a comfortable position. Try lying on your side with your knees slightly bent. If you lie on your back, put a pillow under your knees.  Follow your treatment plan as told by your health care provider. This may include: ? Cognitive or behavioral therapy. ? Acupuncture or massage therapy. ? Meditation or yoga. Contact a health care provider if:  You have pain that is not relieved with rest or medicine.  You have increasing pain going down into your legs or buttocks.  Your pain does not improve after 2 weeks.  You have pain at night.  You lose weight without trying.  You have a fever or chills. Get help right away if:  You develop new bowel or bladder control problems.  You have unusual weakness or numbness in your arms or legs.  You develop nausea or vomiting.  You develop abdominal pain.  You feel faint. Summary  Acute back pain is sudden and usually short-lived.  Use proper lifting techniques. When you bend and lift, use positions that put less stress on your back.  Take over-the-counter and prescription medicines and apply heat or ice as directed by your health care provider. This  information is not intended to replace advice given to you by your health care provider. Make sure you discuss any questions you have with your health care provider. Document Revised: 05/10/2018 Document Reviewed: 09/02/2016 Elsevier Patient Education  2020 [place], Adult Stress is a normal reaction to life events. Stress is what you feel when life demands more than you are used to, or more than you think you can handle. Some stress can be useful, such as studying for a test or meeting a deadline at work. Stress that occurs too often or for too long can cause problems. It can affect your emotional health and interfere with relationships and normal daily activities. Too much stress can weaken your body's defense system (immune system) and increase your risk for physical illness. If you already have a medical problem, stress can make it worse. What are the causes? All sorts of life events can cause stress. An event that  causes stress for one person may not be stressful for another person. Major life events, whether positive or negative, commonly cause stress. Examples include:  Losing a job or starting a new job.  Losing a loved one.  Moving to a new town or home.  Getting married or divorced.  Having a baby.  Getting injured or sick. Less obvious life events can also cause stress, especially if they occur day after day or in combination with each other. Examples include:  Working long hours.  Driving in traffic.  Caring for children.  Being in debt.  Being in a difficult relationship. What are the signs or symptoms? Stress can cause emotional symptoms, including:  Anxiety. This is feeling worried, afraid, on edge, overwhelmed, or out of control.  Anger, including irritation or impatience.  Depression. This is feeling sad, down, helpless, or guilty.  Trouble focusing, remembering, or making decisions. Stress can cause physical symptoms, including:  Aches  and pains. These may affect your head, neck, back, stomach, or other areas of your body.  Tight muscles or a clenched jaw.  Low energy.  Trouble sleeping. Stress can cause unhealthy behaviors, including:  Eating to feel better (overeating) or skipping meals.  Working too much or putting off tasks.  Smoking, drinking alcohol, or using drugs to feel better. How is this diagnosed? Stress is diagnosed through an assessment by your health care provider. He or she may diagnose this condition based on:  Your symptoms and any stressful life events.  Your medical history.  Tests to rule out other causes of your symptoms. Depending on your condition, your health care provider may refer you to a specialist for further evaluation. How is this treated?  Stress management techniques are the recommended treatment for stress. Medicine is not typically recommended for the treatment of stress. Techniques to reduce your reaction to stressful life events include:  Stress identification. Monitor yourself for symptoms of stress and identify what causes stress for you. These skills may help you to avoid or prepare for stressful events.  Time management. Set your priorities, keep a calendar of events, and learn to say no. Taking these actions can help you avoid making too many commitments. Techniques for coping with stress include:  Rethinking the problem. Try to think realistically about stressful events rather than ignoring them or overreacting. Try to find the positives in a stressful situation rather than focusing on the negatives.  Exercise. Physical exercise can release both physical and emotional tension. The key is to find a form of exercise that you enjoy and do it regularly.  Relaxation techniques. These relax the body and mind. The key is to find one or more that you enjoy and use the techniques regularly. Examples include: ? Meditation, deep breathing, or progressive relaxation  techniques. ? Yoga or tai chi. ? Biofeedback, mindfulness techniques, or journaling. ? Listening to music, being out in nature, or participating in other hobbies.  Practicing a healthy lifestyle. Eat a balanced diet, drink plenty of water, limit or avoid caffeine, and get plenty of sleep.  Having a strong support network. Spend time with family, friends, or other people you enjoy being around. Express your feelings and talk things over with someone you trust. Counseling or talk therapy with a mental health professional may be helpful if you are having trouble managing stress on your own. Follow these instructions at home: Lifestyle   Avoid drugs.  Do not use any products that contain nicotine or tobacco, such as cigarettes, e-cigarettes,  and chewing tobacco. If you need help quitting, ask your health care provider.  Limit alcohol intake to no more than 1 drink a day for nonpregnant women and 2 drinks a day for men. One drink equals 12 oz of beer, 5 oz of wine, or 1 oz of hard liquor  Do not use alcohol or drugs to relax.  Eat a balanced diet that includes fresh fruits and vegetables, whole grains, lean meats, fish, eggs, and beans, and low-fat dairy. Avoid processed foods and foods high in added fat, sugar, and salt.  Exercise at least 30 minutes on 5 or more days each week.  Get 7-8 hours of sleep each night. General instructions   Practice stress management techniques as discussed with your health care provider.  Drink enough fluid to keep your urine clear or pale yellow.  Take over-the-counter and prescription medicines only as told by your health care provider.  Keep all follow-up visits as told by your health care provider. This is important. Contact a health care provider if:  Your symptoms get worse.  You have new symptoms.  You feel overwhelmed by your problems and can no longer manage them on your own. Get help right away if:  You have thoughts of hurting  yourself or others. If you ever feel like you may hurt yourself or others, or have thoughts about taking your own life, get help right away. You can go to your nearest emergency department or call:  Your local emergency services (911 in the [place]  A suicide crisis helpline, such as the [place] at (814)399-5450. This is open 24 hours a day. Summary  Stress is a normal reaction to life events. It can cause problems if it happens too often or for too long.  Practicing stress management techniques is the best way to treat stress.  Counseling or talk therapy with a mental health professional may be helpful if you are having trouble managing stress on your own. This information is not intended to replace advice given to you by your health care provider. Make sure you discuss any questions you have with your health care provider. Document Revised: 08/19/2018 Document Reviewed: 03/11/2016 Elsevier Patient Education  [place].  Insomnia Insomnia is a sleep disorder that makes it difficult to fall asleep or stay asleep. Insomnia can cause fatigue, low energy, difficulty concentrating, mood swings, and poor performance at work or school. There are three different ways to classify insomnia:  Difficulty falling asleep.  Difficulty staying asleep.  Waking up too early in the morning. Any type of insomnia can be long-term (chronic) or short-term (acute). Both are common. Short-term insomnia usually lasts for three months or less. Chronic insomnia occurs at least three times a week for longer than three months. What are the causes? Insomnia may be caused by another condition, situation, or substance, such as:  Anxiety.  Certain medicines.  Gastroesophageal reflux disease (GERD) or other gastrointestinal conditions.  Asthma or other breathing conditions.  Restless legs syndrome, sleep apnea, or other sleep disorders.  Chronic  pain.  Menopause.  Stroke.  Abuse of alcohol, tobacco, or illegal drugs.  Mental health conditions, such as depression.  Caffeine.  Neurological disorders, such as Alzheimer's disease.  An overactive thyroid (hyperthyroidism). Sometimes, the cause of insomnia may not be known. What increases the risk? Risk factors for insomnia include:  Gender. Women are affected more often than men.  Age. Insomnia is more common as you get older.  Stress.  Lack of exercise.  Irregular work schedule or working night shifts.  Traveling between different time zones.  Certain medical and mental health conditions. What are the signs or symptoms? If you have insomnia, the main symptom is having trouble falling asleep or having trouble staying asleep. This may lead to other symptoms, such as:  Feeling fatigued or having low energy.  Feeling nervous about going to sleep.  Not feeling rested in the morning.  Having trouble concentrating.  Feeling irritable, anxious, or depressed. How is this diagnosed? This condition may be diagnosed based on:  Your symptoms and medical history. Your health care provider may ask about: ? Your sleep habits. ? Any medical conditions you have. ? Your mental health.  A physical exam. How is this treated? Treatment for insomnia depends on the cause. Treatment may focus on treating an underlying condition that is causing insomnia. Treatment may also include:  Medicines to help you sleep.  Counseling or therapy.  Lifestyle adjustments to help you sleep better. Follow these instructions at home: Eating and drinking   Limit or avoid alcohol, caffeinated beverages, and cigarettes, especially close to bedtime. These can disrupt your sleep.  Do not eat a large meal or eat spicy foods right before bedtime. This can lead to digestive discomfort that can make it hard for you to sleep. Sleep habits   Keep a sleep diary to help you and your health care  provider figure out what could be causing your insomnia. Write down: ? When you sleep. ? When you wake up during the night. ? How well you sleep. ? How rested you feel the next day. ? Any side effects of medicines you are taking. ? What you eat and drink.  Make your bedroom a dark, comfortable place where it is easy to fall asleep. ? Put up shades or blackout curtains to block light from outside. ? Use a white noise machine to block noise. ? Keep the temperature cool.  Limit screen use before bedtime. This includes: ? Watching TV. ? Using your smartphone, tablet, or computer.  Stick to a routine that includes going to bed and waking up at the same times every day and night. This can help you fall asleep faster. Consider making a quiet activity, such as reading, part of your nighttime routine.  Try to avoid taking naps during the day so that you sleep better at night.  Get out of bed if you are still awake after 15 minutes of trying to sleep. Keep the lights down, but try reading or doing a quiet activity. When you feel sleepy, go back to bed. General instructions  Take over-the-counter and prescription medicines only as told by your health care provider.  Exercise regularly, as told by your health care provider. Avoid exercise starting several hours before bedtime.  Use relaxation techniques to manage stress. Ask your health care provider to suggest some techniques that may work well for you. These may include: ? Breathing exercises. ? Routines to release muscle tension. ? Visualizing peaceful scenes.  Make sure that you drive carefully. Avoid driving if you feel very sleepy.  Keep all follow-up visits as told by your health care provider. This is important. Contact a health care provider if:  You are tired throughout the day.  You have trouble in your daily routine due to sleepiness.  You continue to have sleep problems, or your sleep problems get worse. Get help right  away if:  You have serious thoughts about hurting yourself  or someone else. If you ever feel like you may hurt yourself or others, or have thoughts about taking your own life, get help right away. You can go to your nearest emergency department or call:  Your local emergency services (911 in the [place]  A suicide crisis helpline, such as the [facility] at (704) 686-7232. This is open 24 hours a day. Summary  Insomnia is a sleep disorder that makes it difficult to fall asleep or stay asleep.  Insomnia can be long-term (chronic) or short-term (acute).  Treatment for insomnia depends on the cause. Treatment may focus on treating an underlying condition that is causing insomnia.  Keep a sleep diary to help you and your health care provider figure out what could be causing your insomnia. This information is not intended to replace advice given to you by your health care provider. Make sure you discuss any questions you have with your health care provider. Document Revised: 01/01/2017 Document Reviewed: 10/29/2016 Elsevier Patient Education  [place].    Type 2 Diabetes Mellitus, Self Care, Adult Caring for yourself after you have been diagnosed with type 2 diabetes (type 2 diabetes mellitus) means keeping your blood sugar (glucose) under control with a balance of:  Nutrition.  Exercise.  Lifestyle changes.  Medicines or insulin, if necessary.  Support from your team of health care providers and others. The following information explains what you need to know to manage your diabetes at home. What are the risks? Having diabetes can put you at risk for other long-term (chronic) conditions, such as heart disease and kidney disease. Your health care provider may prescribe medicines to help prevent complications from diabetes. These medicines may include:  Aspirin.  Medicine to lower cholesterol.  Medicine to control blood pressure. How to monitor  blood glucose   Check your blood glucose every day, as often as told by your health care provider.  Have your A1c (hemoglobin A1c) level checked two or more times a year, or as often as told by your health care provider. Your health care provider will set individualized treatment goals for you. Generally, the goal of treatment is to maintain the following blood glucose levels:  Before meals (preprandial): 80-130 mg/dL (4.4-7.2 mmol/L).  After meals (postprandial): below 180 mg/dL (10 mmol/L).  A1c level: less than 7%. How to manage hyperglycemia and hypoglycemia Hyperglycemia symptoms Hyperglycemia, also called high blood glucose, occurs when blood glucose is too high. Make sure you know the early signs of hyperglycemia, such as:  Increased thirst.  Hunger.  Feeling very tired.  Needing to urinate more often than usual.  Blurry vision. Hypoglycemia symptoms Hypoglycemia, also called low blood glucose, occurswith a blood glucose level at or below 70 mg/dL (3.9 mmol/L). The risk for hypoglycemia increases during or after exercise, during sleep, during illness, and when skipping meals or not eating for a long time (fasting). It is important to know the symptoms of hypoglycemia and treat it right away. Always have a 15-gram rapid-acting carbohydrate snack with you to treat low blood glucose. Family members and close friends should also know the symptoms and should understand how to treat hypoglycemia, in case you are not able to treat yourself. Symptoms may include:  Hunger.  Anxiety.  Sweating and feeling clammy.  Confusion.  Dizziness or feeling light-headed.  Sleepiness.  Nausea.  Increased heart rate.  Headache.  Blurry vision.  Irritability.  A change in coordination.  Tingling or numbness around the mouth, lips, or tongue.  Restless sleep.  Fainting.  Seizure. Treating hypoglycemia If you are alert and able to swallow safely, follow the 15:15  rule:  Take 15 grams of a rapid-acting carbohydrate. Talk with your health care provider about how much you should take.  Rapid-acting options include: ? Glucose pills (take 15 grams). ? 6-8 pieces of hard candy. ? 4-6 oz (120-150 mL) of fruit juice. ? 4-6 oz (120-150 mL) of regular (not diet) soda. ? 1 Tbsp (15 mL) honey or sugar.  Check your blood glucose 15 minutes after you take the carbohydrate.  If the repeat blood glucose level is still at or below 70 mg/dL (3.9 mmol/L), take 15 grams of a carbohydrate again.  If your blood glucose level does not increase above 70 mg/dL (3.9 mmol/L) after 3 tries, seek emergency medical care.  After your blood glucose level returns to normal, eat a meal or a snack within 1 hour. Treating severe hypoglycemia Severe hypoglycemia is when your blood glucose level is at or below 54 mg/dL (3 mmol/L). Severe hypoglycemia is an emergency. Do not wait to see if the symptoms will go away. Get medical help right away. Call your local emergency services (911 in the [place] If you have severe hypoglycemia and you cannot eat or drink, you may need an injection of glucagon. A family member or close friend should learn how to check your blood glucose and how to give you a glucagon injection. Ask your health care provider if you need to have an emergency glucagon injection kit available. Severe hypoglycemia may need to be treated in a hospital. The treatment may include getting glucose through an IV. You may also need treatment for the cause of your hypoglycemia. Follow these instructions at home: Take diabetes medicines as told  If your health care provider prescribed insulin or diabetes medicines, take them every day.  Do not run out of insulin or other diabetes medicines that you take. Plan ahead so you always have these available.  If you use insulin, adjust your dosage based on how physically active you are and what foods you eat. Your health care provider  will tell you how to adjust your dosage. Make healthy food choices  The things that you eat and drink affect your blood glucose and your insulin dosage. Making good choices helps to control your diabetes and prevent other health problems. A healthy meal plan includes eating lean proteins, complex carbohydrates, fresh fruits and vegetables, low-fat dairy products, and healthy fats. Make an appointment to see a diet and nutrition specialist (registered dietitian) to help you create an eating plan that is right for you. Make sure that you:  Follow instructions from your health care provider about eating or drinking restrictions.  Drink enough fluid to keep your urine pale yellow.  Keep a record of the carbohydrates that you eat. Do this by reading food labels and learning the standard serving sizes of foods.  Follow your sick day plan whenever you cannot eat or drink as usual. Make this plan in advance with your health care provider.  Stay active Exercise regularly, as told by your health care provider. This may include:  Stretching and doing strength exercises, such as yoga or weightlifting, 2 or more times a week.  Doing 150 minutes or more of moderate-intensity or vigorous-intensity exercise each week. This could be brisk walking, biking, or water aerobics. ? Spread out your activity over 3 or more days of the week. ? Do not go more than 2 days  in a row without doing some kind of physical activity. When you start a new exercise or activity, work with your health care provider to adjust your insulin, medicines, or food intake as needed. Make healthy lifestyle choices  Do not use any tobacco products, such as cigarettes, chewing tobacco, and e-cigarettes. If you need help quitting, ask your health care provider.  If your health care provider says that alcohol is safe for you, limit alcohol intake to no more than 1 drink per day for nonpregnant women and 2 drinks per day for men. One drink  equals 12 oz of beer (355 mL), 5 oz of wine (148 mL), or 1 oz of hard liquor (44 mL).  Learn to manage stress. If you need help with this, ask your health care provider. Care for your body   Keep your immunizations up to date. In addition to getting vaccinations as told by your health care provider, it is recommended that you get vaccinated against the following illnesses: ? The flu (influenza). Get a flu shot every year. ? Pneumonia. ? Hepatitis B.  Schedule an eye exam soon after your diagnosis, and then one time every year after that.  Check your skin and feet every day for cuts, bruises, redness, blisters, or sores. Schedule a foot exam with your health care provider once every year.  Brush your teeth and gums two times a day, and floss one or more times a day. Visit your dentist one or more times every 6 months.  Maintain a healthy weight. General instructions  Take over-the-counter and prescription medicines only as told by your health care provider.  Share your diabetes management plan with people in your workplace, school, and household.  Carry a medical alert card or wear medical alert jewelry.  Keep all follow-up visits as told by your health care provider. This is important. Questions to ask your health care provider  Do I need to meet with a diabetes educator?  Where can I find a support group for people with diabetes? Where to find more information For more information about diabetes, visit:  American Diabetes Association (ADA): www.diabetes.org  American Association of Diabetes Educators (AADE): www.diabeteseducator.org Summary  Caring for yourself after you have been diagnosed with (type 2 diabetes mellitus) means keeping your blood sugar (glucose) under control with a balance of nutrition, exercise, lifestyle changes, and medicine.  Check your blood glucose every day, as often as told by your health care provider.  Having diabetes can put you at risk for  other long-term (chronic) conditions, such as heart disease and kidney disease. Your health care provider may prescribe medicines to help prevent complications from diabetes.  Keep all follow-up visits as told by your health care provider. This is important. This information is not intended to replace advice given to you by your health care provider. Make sure you discuss any questions you have with your health care provider. Document Revised: 07/12/2017 Document Reviewed: 02/22/2015 Elsevier Patient Education  [place] [facility]  Get These Tests  Blood Pressure- Have your blood pressure checked by your healthcare provider at least once a year.  Normal blood pressure is 120/80.  Weight- Have your body mass index (BMI) calculated to screen for obesity.  BMI is a measure of body fat based on height and weight.  You can calculate your own BMI at [employer]  Cholesterol- Have your cholesterol checked every year.  Diabetes- Have your blood sugar checked every year if you have high  blood pressure, high cholesterol, a family history of diabetes or if you are overweight.  Pap Test - Have a pap test every 1 to 5 years if you have been sexually active.  If you are older than 65 and recent pap tests have been normal you may not need additional pap tests.  In addition, if you have had a hysterectomy  for benign disease additional pap tests are not necessary.  Mammogram-Yearly mammograms are essential for early detection of breast cancer  Screening for Colon Cancer- Colonoscopy starting at age 51. Screening may begin sooner depending on your family history and other health conditions.  Follow up colonoscopy as directed by your Gastroenterologist.  Screening for Osteoporosis- Screening begins at age 84 with bone density scanning, sooner if you are at higher risk for developing Osteoporosis.  Get these medicines  Calcium with Vitamin D- Your body requires 1200-1500  mg of Calcium a day and [phone] IU of Vitamin D a day.  You can only absorb 500 mg of Calcium at a time therefore Calcium must be taken in 2 or 3 separate doses throughout the day.  Hormones- Hormone therapy has been associated with increased risk for certain cancers and heart disease.  Talk to your healthcare provider about if you need relief from menopausal symptoms.  Aspirin- Ask your healthcare provider about taking Aspirin to prevent Heart Disease and Stroke.  Get these Immuniztions  Flu shot- Every fall  Pneumonia shot- Once after the age of 24; if you are younger ask your healthcare provider if you need a pneumonia shot.  Tetanus- Every ten years.  Zostavax- Once after the age of 1 to prevent shingles.  Take these steps  Don't smoke- Your healthcare provider can help you quit. For tips on how to quit, ask your healthcare provider or go to www.smokefree.gov or call 1-800 QUIT-NOW.  Be physically active- Exercise 5 days a week for a minimum of 30 minutes.  If you are not already physically active, start slow and gradually work up to 30 minutes of moderate physical activity.  Try walking, dancing, bike riding, swimming, etc.  Eat a healthy diet- Eat a variety of healthy foods such as fruits, vegetables, whole grains, low fat milk, low fat cheeses, yogurt, lean meats, chicken, fish, eggs, dried beans, tofu, etc.  For more information go to www.thenutritionsource.org  Dental visit- Brush and floss teeth twice daily; visit your dentist twice a year.  Eye exam- Visit your Optometrist or Ophthalmologist yearly.  Drink alcohol in moderation- Limit alcohol intake to one drink or less a day.  Never drink and drive.  Depression- Your emotional health is as important as your physical health.  If you're feeling down or losing interest in things you normally enjoy, please talk to your healthcare provider.  Seat Belts- can save your life; always wear one  Smoke/Carbon Monoxide detectors-  These detectors need to be installed on the appropriate level of your home.  Replace batteries at least once a year.  Violence- If anyone is threatening or hurting you, please tell your healthcare provider.  Living Will/ Health care power of attorney- Discuss with your healthcare provider and family.  If you have lab work done today you will be contacted with your lab results within the next 2 weeks.  If you have not heard from [place] then please contact us. The fastest way to get your results is to register for My Chart.   IF you received an x-ray today, you will receive an invoice  from [facility]. Please contact [facility] at [phone] with questions or concerns regarding your invoice.   IF you received labwork today, you will receive an invoice from [facility]. Please contact [facility] at [phone] with questions or concerns regarding your invoice.   Our billing staff will not be able to assist you with questions regarding bills from these companies.  You will be contacted with the lab results as soon as they are available. The fastest way to get your results is to activate your My Chart account. Instructions are located on the last page of this paperwork. If you have not heard from [place] regarding the results in 2 weeks, please contact this office.

## 2019-03-16 NOTE — Progress Notes (Signed)
Subjective:  Patient ID: Robin Delacruz, female    DOB: Jul 20, 1965  Age: 54 y.o. MRN: [id number]  CC:  Chief Complaint  Patient presents with  . Annual Exam    pt feels a littel fatigued and isn't sleeping well. pt gets 6 hours of sleep a night pt wakes not feeling fully rested. pt is working a lot more than usual. pt states her BS has been running really high. pt had a BS resently of 360 mg/dl, but today fasting pt states she is at 175m/dl. pt states her medication have been working well with no side effects.    HPI EElanna Delacruz for   Annual physical exam, other concerns as above. And per ROS.   Diabetes: Complicated by hyperglycemia, last tested in May 2020.  No recent A1c. Continud on  Metformin 500 mg twice daily.  Diet control had declined, missing 2nd dose of metformin, and exercise decreased with pandemic.  Her readings are variable but has had some elevated readings -up to 360 2 months ago, made some diet changes, increased exercise.  Fasting 128 yesterday, 2hr PP 212 yesterday. 150 today.  Some fatigue.  Difficulty with restful sleep. Some increased thirst - has noticed recently, not drinking during the day - needs to drink more water during day. Difficult with work.  No blurry vision/n/v abd pain.  Microalbumin: Normal ratio 06/23/2018 She is on statin. Optho, foot exam, pneumovax: Due for foot exam today.  Otherwise up-to-date. optho appt 2 weeks ago.   Diabetic Foot Exam - Simple   Simple Foot Form Visual Inspection No deformities, no ulcerations, no other skin breakdown bilaterally: Yes Sensation Testing Intact to touch and monofilament testing bilaterally: Yes Pulse Check Posterior Tibialis and Dorsalis pulse intact bilaterally: Yes Comments     Lab Results  Component Value Date   HGBA1C 7.5 (H) 06/23/2018   HGBA1C 7.4 (H) 09/02/2017   HGBA1C 6.9 (H) 06/01/2017   Lab Results  Component Value Date   MICROALBUR 1.1 08/04/2014   LDLCALC  52 06/23/2018   CREATININE 1.06 (H) 06/23/2018   Hypertension: Amlodipine 5 mg daily, benazepril 40 mg daily. No missed doses.  Home readings:128/88 yesterday.  No chest pains, weakness. Rare afternoon headache few days per week.  BP Readings from Last 3 Encounters:  03/16/19 (!) 170/100  10/14/17 137/76  09/16/17 132/78   Lab Results  Component Value Date   CREATININE 1.06 (H) 06/23/2018   Fatigue, insomnia: Trouble falling and staying asleep. More stress with work since last May. Similar time of trouble sleeping. No hx of OSA/CPAP. Denies depression.  Tx: hot bath at night, tea, diffusers.  Caffeine: am only.  Exercise - started Peloton few weeks ago. Some improvement.   Night sweats: Hot flushes since last March - during day, and at night - notes every night now, prior hysterectomy. No fever, no unexplained weight loss.   Back Pain: Occasional with position only - notes after standing up for awhile - months. No injury/falls.  Tightness.  No bowel or bladder incontinence, no saddle anesthesia, no lower extremity weakness. no leg radiation.  Tx: ibuprofen on occasion - few per week, heating pad.   Cancer screening Cologuard 08/10/2016.  Due for mammogram, last one noted 04/23/2016.  Plans on scheduling.  Last Pap testing April 29, 2016, with HPV screening - [facility]   Immunization History  Administered Date(s) Administered  . Hepatitis B 05/10/2017, 06/01/2017  . Hepatitis B, adult 05/10/2017, 06/01/2017  . Influenza  Split 10/29/2011  . Influenza,inj,Quad PF,6+ Mos 12/13/2012, 10/14/2017, 10/03/2018  . Influenza-Unspecified 11/03/2015  . Pneumococcal Polysaccharide-23 12/15/2012, 05/10/2017  . Tdap 06/05/2016  Shingles:  Depression screen [facility] 2/9 03/16/2019 10/14/2017 09/16/2017 09/02/2017 06/01/2017  Decreased Interest 0 0 0 0 0  Down, Depressed, Hopeless 0 0 0 0 0  PHQ - 2 Score 0 0 0 0 0     Hearing Screening   125Hz  250Hz  500Hz  1000Hz  2000Hz   3000Hz  4000Hz  6000Hz  8000Hz   Right ear:           Left ear:             Visual Acuity Screening   Right eye Left eye Both eyes  Without correction: 20/20 20/40 20/20   With correction:     optho - 2 weeks ago.   Dental: last week.   Exercise:peloton 3 days per week.    History Patient Active Problem List   Diagnosis Date Noted  . Type 2 diabetes mellitus without complication ([place]) 71/07/2692  . Ovarian cyst   . Anemia   . Dyspnea   . Fatigue   . Chest pain 11/02/2011  . Hypertension    Past Medical History:  Diagnosis Date  . Allergy    seasonal  . Anemia   . Chest pain   . Diabetes mellitus    Diagnosed at age 89  . Dyspnea   . Fatigue   . Hypertension   . Ovarian cyst    Past Surgical History:  Procedure Laterality Date  . ABLATION    . CESAREAN SECTION    . TONSILLECTOMY    . TUBAL LIGATION     Allergies  Allergen Reactions  . Artichoke [Cynara Scolymus (Artichoke)] Hives  . Codeine Anaphylaxis and Hives  . Penicillins Anaphylaxis and Hives  . Sulfa Antibiotics Anaphylaxis and Hives   Prior to Admission medications   Medication Sig Start Date End Date Taking? Authorizing Provider  albuterol (PROVENTIL HFA;VENTOLIN HFA) 108 (90 Base) MCG/ACT inhaler Inhale 1-2 puffs into the lungs every 4 (four) hours as needed for wheezing or shortness of breath (cough, shortness of breath or wheezing.). 06/05/16  Yes Wendie Agreste, MD  amLODipine (NORVASC) 5 MG tablet Take 1 tablet (5 mg total) by mouth daily. 06/17/18  Yes Wendie Agreste, MD  aspirin EC 81 MG tablet Take 1 tablet (81 mg total) by mouth daily. 11/02/11  Yes Lelon Perla, MD  atorvastatin (LIPITOR) 20 MG tablet Take 1 tablet (20 mg total) by mouth daily. 06/17/18  Yes Wendie Agreste, MD  benazepril (LOTENSIN) 40 MG tablet Take 1 tablet (40 mg total) by mouth daily. 06/17/18  Yes Wendie Agreste, MD  metFORMIN (GLUCOPHAGE) 500 MG tablet Take 1 tablet (500 mg total) by mouth 2 (two) times daily  with a meal. 06/17/18  Yes Wendie Agreste, MD  cyclobenzaprine (FLEXERIL) 5 MG tablet Take 1 tablet (5 mg total) by mouth 3 (three) times daily as needed for muscle spasms (start qhs prn due to sedation). Patient not taking: Reported on 03/16/2019 09/16/17   Wendie Agreste, MD   Social History   Socioeconomic History  . Marital status: Divorced    Spouse name: Not on file  . Number of children: 2  . Years of education: Not on file  . Highest education level: Not on file  Occupational History  . Occupation: MENTAL HEALTH    Employer: [employer]  Tobacco Use  . Smoking status: Never Smoker  . Smokeless  tobacco: Never Used  Substance and Sexual Activity  . Alcohol use: No    Alcohol/week: 0.0 standard drinks  . Drug use: No  . Sexual activity: Never  Other Topics Concern  . Not on file  Social History Narrative  . Not on file   Social Determinants of Health   Financial Resource Strain:   . Difficulty of Paying Living Expenses: Not on file  Food Insecurity:   . Worried About Charity fundraiser in the Last Year: Not on file  . Ran Out of Food in the Last Year: Not on file  Transportation Needs:   . Lack of Transportation (Medical): Not on file  . Lack of Transportation (Non-Medical): Not on file  Physical Activity:   . Days of Exercise per Week: Not on file  . Minutes of Exercise per Session: Not on file  Stress:   . Feeling of Stress : Not on file  Social Connections:   . Frequency of Communication with Friends and Family: Not on file  . Frequency of Social Gatherings with Friends and Family: Not on file  . Attends Religious Services: Not on file  . Active Member of Clubs or Organizations: Not on file  . Attends Archivist Meetings: Not on file  . Marital Status: Not on file  Intimate Partner Violence:   . Fear of Current or Ex-Partner: Not on file  . Emotionally Abused: Not on file  . Physically Abused: Not on file  . Sexually Abused: Not on file      Review of Systems 13 point review of systems per patient health survey noted.  Negative other than as indicated above or in HPI.    Objective:   Vitals:   03/16/19 0955 03/16/19 1010 03/16/19 1137  BP: (!) 162/91 (!) 170/100 (!) 144/88  Pulse: 72    Temp: 98 F (36.7 C)    TempSrc: Temporal    SpO2: 98%    Weight: 196 lb (88.9 kg)    Height: 5' 6"  (1.676 m)       Physical Exam Vitals reviewed.  Constitutional:      Appearance: She is well-developed.  HENT:     Head: Normocephalic and atraumatic.  Eyes:     Conjunctiva/sclera: Conjunctivae normal.     Pupils: Pupils are equal, round, and reactive to light.  Neck:     Vascular: No carotid bruit.  Cardiovascular:     Rate and Rhythm: Normal rate and regular rhythm.     Heart sounds: Normal heart sounds.  Pulmonary:     Effort: Pulmonary effort is normal.     Breath sounds: Normal breath sounds.  Abdominal:     Palpations: Abdomen is soft. There is no pulsatile mass.     Tenderness: There is no abdominal tenderness.  Musculoskeletal:     Comments: Lower lumbar spine, mild discomfort of paraspinal muscles, no focal bony tenderness midline.  Negative seated straight leg raise.  Skin:    General: Skin is warm and dry.  Neurological:     General: No focal deficit present.     Mental Status: She is alert and oriented to person, place, and time.  Psychiatric:        Mood and Affect: Mood normal.        Behavior: Behavior normal.        Thought Content: Thought content normal.     Results for orders placed or performed in visit on 03/16/19  POCT glycosylated hemoglobin (Hb A1C)  Result Value Ref Range   Hemoglobin A1C 8.7 (A) 4.0 - 5.6 %   HbA1c POC (<> result, manual entry)     HbA1c, POC (prediabetic range)     HbA1c, POC (controlled diabetic range)    POCT glucose (manual entry)  Result Value Ref Range   POC Glucose 184 (A) 70 - 99 mg/dl     Assessment & Plan:  Natasja Niday is a 54 y.o. female  . Annual physical exam  - -anticipatory guidance as below in AVS, screening labs above. Health maintenance items as above in HPI discussed/recommended as applicable.  Type 2 diabetes mellitus without complication, without long-term current use of insulin (HCC) - Plan: benazepril (LOTENSIN) 40 MG tablet, metFORMIN (GLUCOPHAGE) 500 MG tablet Type 2 diabetes mellitus with hyperglycemia, without long-term current use of insulin (HCC) - Plan: Hemoglobin A1c, POCT glycosylated hemoglobin (Hb A1C), POCT glucose (manual entry)  Decreased control, will increase Metformin to 1000 mg in the morning, 500 mg at night, ultimately 1080m twice daily if tolerated.  Recheck 2 weeks.  Hyperlipidemia, unspecified hyperlipidemia type - Plan: Lipid panel, Comprehensive metabolic panel, atorvastatin (LIPITOR) 20 MG tablet Check labs, continue Lipitor.  Insomnia, unspecified type  -Handout given, sleep hygiene discussed, over-the-counter melatonin as option.  Continue exercise, stress management discussed with handout given.  Bilateral low back pain without sciatica, unspecified chronicity  -Suspect mechanical pain, no red flags on exam or history.  Symptomatic care discussed, handout given, recheck 2 weeks Need for shingles vaccine - Plan: Zoster Vaccine Adjuvanted ([facility] injection  Menopausal hot flushes  -Handout given on various over-the-counter treatments as well as possible prescription treatments including hormonal and nonhormonal options.  Will discuss further at follow-up in 2 weeks  Fatigue, unspecified type - Plan: CBC, TSH  -May be related to stress, check CBC, TSH.  RTC precautions  Essential hypertension - Plan: benazepril (LOTENSIN) 40 MG tablet, amLODipine (NORVASC) 5 MG tablet  -Option to increase amlodipine to 10 mg daily, continue Lotensin same dose for now.  Home monitoring, recheck 2 weeks  No orders of the defined types were placed in this encounter.  Patient Instructions    See  handout on menopausal symptoms - will discuss this further next visit.  For sleep - try melatonin at night. See other information on stress and insomnia. continue peloton/exercise.  Follow up in 2 weeks to discuss further.   See info on back pain below - will discuss this further at next visit as well.   Return to the clinic or go to the nearest emergency room if any of your symptoms worsen or new symptoms occur.  COVID-19 Vaccine Information can be found at: [email or web address] questions related to vaccine distribution or appointments, please email [email or web address] or call [phone]   Based on elevated readings, can increase amlodipine to 2 pills/day if home readings over 130/80.  Watch for low blood pressures, lightheadedness, or dizziness.  Will recheck blood pressure next visit as well.  Bring your meter with you to that visit.   Blood sugar elevated, try increasing Metformin to 2 pills in the morning, 1 at night and then ultimately 2 pills twice per day for improved readings may be needed. I anticipate improved diet and exercise will also help those readings.  Can discuss this further at follow-up as well.  Thank you for coming in today   Acute Back Pain, Adult Acute back pain is sudden and usually short-lived. It is often caused by an injury to the muscles and tissues  in the back. The injury may result from:  A muscle or ligament getting overstretched or torn (strained). Ligaments are tissues that connect bones to each other. Lifting something improperly can cause a back strain.  Wear and tear (degeneration) of the spinal disks. Spinal disks are circular tissue that provides cushioning between the bones of the spine (vertebrae).  Twisting motions, such as while playing sports or doing yard work.  A hit to the back.  Arthritis. You may have a physical exam, lab tests, and imaging tests to find the cause of your  pain. Acute back pain usually goes away with rest and home care. Follow these instructions at home: Managing pain, stiffness, and swelling  Take over-the-counter and prescription medicines only as told by your health care provider.  Your health care provider may recommend applying ice during the first 24-48 hours after your pain starts. To do this: ? Put ice in a plastic bag. ? Place a towel between your skin and the bag. ? Leave the ice on for 20 minutes, 2-3 times a day.  If directed, apply heat to the affected area as often as told by your health care provider. Use the heat source that your health care provider recommends, such as a moist heat pack or a heating pad. ? Place a towel between your skin and the heat source. ? Leave the heat on for 20-30 minutes. ? Remove the heat if your skin turns bright red. This is especially important if you are unable to feel pain, heat, or cold. You have a greater risk of getting burned. Activity   Do not stay in bed. Staying in bed for more than 1-2 days can delay your recovery.  Sit up and stand up straight. Avoid leaning forward when you sit, or hunching over when you stand. ? If you work at a desk, sit close to it so you do not need to lean over. Keep your chin tucked in. Keep your neck drawn back, and keep your elbows bent at a right angle. Your arms should look like the letter "L." ? Sit high and close to the steering wheel when you drive. Add lower back (lumbar) support to your car seat, if needed.  Take short walks on even surfaces as soon as you are able. Try to increase the length of time you walk each day.  Do not sit, drive, or stand in one place for more than 30 minutes at a time. Sitting or standing for long periods of time can put stress on your back.  Do not drive or use heavy machinery while taking prescription pain medicine.  Use proper lifting techniques. When you bend and lift, use positions that put less stress on your  back: ? [place] your knees. ? Keep the load close to your body. ? Avoid twisting.  Exercise regularly as told by your health care provider. Exercising helps your back heal faster and helps prevent back injuries by keeping muscles strong and flexible.  Work with a physical therapist to make a safe exercise program, as recommended by your health care provider. Do any exercises as told by your physical therapist. Lifestyle  Maintain a healthy weight. Extra weight puts stress on your back and makes it difficult to have good posture.  Avoid activities or situations that make you feel anxious or stressed. Stress and anxiety increase muscle tension and can make back pain worse. Learn ways to manage anxiety and stress, such as through exercise. General instructions  Sleep on  a firm mattress in a comfortable position. Try lying on your side with your knees slightly bent. If you lie on your back, put a pillow under your knees.  Follow your treatment plan as told by your health care provider. This may include: ? Cognitive or behavioral therapy. ? Acupuncture or massage therapy. ? Meditation or yoga. Contact a health care provider if:  You have pain that is not relieved with rest or medicine.  You have increasing pain going down into your legs or buttocks.  Your pain does not improve after 2 weeks.  You have pain at night.  You lose weight without trying.  You have a fever or chills. Get help right away if:  You develop new bowel or bladder control problems.  You have unusual weakness or numbness in your arms or legs.  You develop nausea or vomiting.  You develop abdominal pain.  You feel faint. Summary  Acute back pain is sudden and usually short-lived.  Use proper lifting techniques. When you bend and lift, use positions that put less stress on your back.  Take over-the-counter and prescription medicines and apply heat or ice as directed by your health care provider. This  information is not intended to replace advice given to you by your health care provider. Make sure you discuss any questions you have with your health care provider. Document Revised: 05/10/2018 Document Reviewed: 09/02/2016 Elsevier Patient Education  2020 [place], Adult Stress is a normal reaction to life events. Stress is what you feel when life demands more than you are used to, or more than you think you can handle. Some stress can be useful, such as studying for a test or meeting a deadline at work. Stress that occurs too often or for too long can cause problems. It can affect your emotional health and interfere with relationships and normal daily activities. Too much stress can weaken your body's defense system (immune system) and increase your risk for physical illness. If you already have a medical problem, stress can make it worse. What are the causes? All sorts of life events can cause stress. An event that causes stress for one person may not be stressful for another person. Major life events, whether positive or negative, commonly cause stress. Examples include:  Losing a job or starting a new job.  Losing a loved one.  Moving to a new town or home.  Getting married or divorced.  Having a baby.  Getting injured or sick. Less obvious life events can also cause stress, especially if they occur day after day or in combination with each other. Examples include:  Working long hours.  Driving in traffic.  Caring for children.  Being in debt.  Being in a difficult relationship. What are the signs or symptoms? Stress can cause emotional symptoms, including:  Anxiety. This is feeling worried, afraid, on edge, overwhelmed, or out of control.  Anger, including irritation or impatience.  Depression. This is feeling sad, down, helpless, or guilty.  Trouble focusing, remembering, or making decisions. Stress can cause physical symptoms, including:  Aches  and pains. These may affect your head, neck, back, stomach, or other areas of your body.  Tight muscles or a clenched jaw.  Low energy.  Trouble sleeping. Stress can cause unhealthy behaviors, including:  Eating to feel better (overeating) or skipping meals.  Working too much or putting off tasks.  Smoking, drinking alcohol, or using drugs to feel better. How is this diagnosed? Stress  is diagnosed through an assessment by your health care provider. He or she may diagnose this condition based on:  Your symptoms and any stressful life events.  Your medical history.  Tests to rule out other causes of your symptoms. Depending on your condition, your health care provider may refer you to a specialist for further evaluation. How is this treated?  Stress management techniques are the recommended treatment for stress. Medicine is not typically recommended for the treatment of stress. Techniques to reduce your reaction to stressful life events include:  Stress identification. Monitor yourself for symptoms of stress and identify what causes stress for you. These skills may help you to avoid or prepare for stressful events.  Time management. Set your priorities, keep a calendar of events, and learn to say no. Taking these actions can help you avoid making too many commitments. Techniques for coping with stress include:  Rethinking the problem. Try to think realistically about stressful events rather than ignoring them or overreacting. Try to find the positives in a stressful situation rather than focusing on the negatives.  Exercise. Physical exercise can release both physical and emotional tension. The key is to find a form of exercise that you enjoy and do it regularly.  Relaxation techniques. These relax the body and mind. The key is to find one or more that you enjoy and use the techniques regularly. Examples include: ? Meditation, deep breathing, or progressive relaxation  techniques. ? Yoga or tai chi. ? Biofeedback, mindfulness techniques, or journaling. ? Listening to music, being out in nature, or participating in other hobbies.  Practicing a healthy lifestyle. Eat a balanced diet, drink plenty of water, limit or avoid caffeine, and get plenty of sleep.  Having a strong support network. Spend time with family, friends, or other people you enjoy being around. Express your feelings and talk things over with someone you trust. Counseling or talk therapy with a mental health professional may be helpful if you are having trouble managing stress on your own. Follow these instructions at home: Lifestyle   Avoid drugs.  Do not use any products that contain nicotine or tobacco, such as cigarettes, e-cigarettes, and chewing tobacco. If you need help quitting, ask your health care provider.  Limit alcohol intake to no more than 1 drink a day for nonpregnant women and 2 drinks a day for men. One drink equals 12 oz of beer, 5 oz of wine, or 1 oz of hard liquor  Do not use alcohol or drugs to relax.  Eat a balanced diet that includes fresh fruits and vegetables, whole grains, lean meats, fish, eggs, and beans, and low-fat dairy. Avoid processed foods and foods high in added fat, sugar, and salt.  Exercise at least 30 minutes on 5 or more days each week.  Get 7-8 hours of sleep each night. General instructions   Practice stress management techniques as discussed with your health care provider.  Drink enough fluid to keep your urine clear or pale yellow.  Take over-the-counter and prescription medicines only as told by your health care provider.  Keep all follow-up visits as told by your health care provider. This is important. Contact a health care provider if:  Your symptoms get worse.  You have new symptoms.  You feel overwhelmed by your problems and can no longer manage them on your own. Get help right away if:  You have thoughts of hurting  yourself or others. If you ever feel like you may hurt yourself or others,  or have thoughts about taking your own life, get help right away. You can go to your nearest emergency department or call:  Your local emergency services (911 in the [place]  A suicide crisis helpline, such as the [facility] at 5094356998. This is open 24 hours a day. Summary  Stress is a normal reaction to life events. It can cause problems if it happens too often or for too long.  Practicing stress management techniques is the best way to treat stress.  Counseling or talk therapy with a mental health professional may be helpful if you are having trouble managing stress on your own. This information is not intended to replace advice given to you by your health care provider. Make sure you discuss any questions you have with your health care provider. Document Revised: 08/19/2018 Document Reviewed: 03/11/2016 Elsevier Patient Education  [place].  Insomnia Insomnia is a sleep disorder that makes it difficult to fall asleep or stay asleep. Insomnia can cause fatigue, low energy, difficulty concentrating, mood swings, and poor performance at work or school. There are three different ways to classify insomnia:  Difficulty falling asleep.  Difficulty staying asleep.  Waking up too early in the morning. Any type of insomnia can be long-term (chronic) or short-term (acute). Both are common. Short-term insomnia usually lasts for three months or less. Chronic insomnia occurs at least three times a week for longer than three months. What are the causes? Insomnia may be caused by another condition, situation, or substance, such as:  Anxiety.  Certain medicines.  Gastroesophageal reflux disease (GERD) or other gastrointestinal conditions.  Asthma or other breathing conditions.  Restless legs syndrome, sleep apnea, or other sleep disorders.  Chronic  pain.  Menopause.  Stroke.  Abuse of alcohol, tobacco, or illegal drugs.  Mental health conditions, such as depression.  Caffeine.  Neurological disorders, such as Alzheimer's disease.  An overactive thyroid (hyperthyroidism). Sometimes, the cause of insomnia may not be known. What increases the risk? Risk factors for insomnia include:  Gender. Women are affected more often than men.  Age. Insomnia is more common as you get older.  Stress.  Lack of exercise.  Irregular work schedule or working night shifts.  Traveling between different time zones.  Certain medical and mental health conditions. What are the signs or symptoms? If you have insomnia, the main symptom is having trouble falling asleep or having trouble staying asleep. This may lead to other symptoms, such as:  Feeling fatigued or having low energy.  Feeling nervous about going to sleep.  Not feeling rested in the morning.  Having trouble concentrating.  Feeling irritable, anxious, or depressed. How is this diagnosed? This condition may be diagnosed based on:  Your symptoms and medical history. Your health care provider may ask about: ? Your sleep habits. ? Any medical conditions you have. ? Your mental health.  A physical exam. How is this treated? Treatment for insomnia depends on the cause. Treatment may focus on treating an underlying condition that is causing insomnia. Treatment may also include:  Medicines to help you sleep.  Counseling or therapy.  Lifestyle adjustments to help you sleep better. Follow these instructions at home: Eating and drinking   Limit or avoid alcohol, caffeinated beverages, and cigarettes, especially close to bedtime. These can disrupt your sleep.  Do not eat a large meal or eat spicy foods right before bedtime. This can lead to digestive discomfort that can make it hard for you to sleep. Sleep  habits   Keep a sleep diary to help you and your health care  provider figure out what could be causing your insomnia. Write down: ? When you sleep. ? When you wake up during the night. ? How well you sleep. ? How rested you feel the next day. ? Any side effects of medicines you are taking. ? What you eat and drink.  Make your bedroom a dark, comfortable place where it is easy to fall asleep. ? Put up shades or blackout curtains to block light from outside. ? Use a white noise machine to block noise. ? Keep the temperature cool.  Limit screen use before bedtime. This includes: ? Watching TV. ? Using your smartphone, tablet, or computer.  Stick to a routine that includes going to bed and waking up at the same times every day and night. This can help you fall asleep faster. Consider making a quiet activity, such as reading, part of your nighttime routine.  Try to avoid taking naps during the day so that you sleep better at night.  Get out of bed if you are still awake after 15 minutes of trying to sleep. Keep the lights down, but try reading or doing a quiet activity. When you feel sleepy, go back to bed. General instructions  Take over-the-counter and prescription medicines only as told by your health care provider.  Exercise regularly, as told by your health care provider. Avoid exercise starting several hours before bedtime.  Use relaxation techniques to manage stress. Ask your health care provider to suggest some techniques that may work well for you. These may include: ? Breathing exercises. ? Routines to release muscle tension. ? Visualizing peaceful scenes.  Make sure that you drive carefully. Avoid driving if you feel very sleepy.  Keep all follow-up visits as told by your health care provider. This is important. Contact a health care provider if:  You are tired throughout the day.  You have trouble in your daily routine due to sleepiness.  You continue to have sleep problems, or your sleep problems get worse. Get help right  away if:  You have serious thoughts about hurting yourself or someone else. If you ever feel like you may hurt yourself or others, or have thoughts about taking your own life, get help right away. You can go to your nearest emergency department or call:  Your local emergency services (911 in the [place]  A suicide crisis helpline, such as the [place] at 603-829-0680. This is open 24 hours a day. Summary  Insomnia is a sleep disorder that makes it difficult to fall asleep or stay asleep.  Insomnia can be long-term (chronic) or short-term (acute).  Treatment for insomnia depends on the cause. Treatment may focus on treating an underlying condition that is causing insomnia.  Keep a sleep diary to help you and your health care provider figure out what could be causing your insomnia. This information is not intended to replace advice given to you by your health care provider. Make sure you discuss any questions you have with your health care provider. Document Revised: 01/01/2017 Document Reviewed: 10/29/2016 Elsevier Patient Education  [place].    Type 2 Diabetes Mellitus, Self Care, Adult Caring for yourself after you have been diagnosed with type 2 diabetes (type 2 diabetes mellitus) means keeping your blood sugar (glucose) under control with a balance of:  Nutrition.  Exercise.  Lifestyle changes.  Medicines or insulin, if necessary.  Support from your  team of health care providers and others. The following information explains what you need to know to manage your diabetes at home. What are the risks? Having diabetes can put you at risk for other long-term (chronic) conditions, such as heart disease and kidney disease. Your health care provider may prescribe medicines to help prevent complications from diabetes. These medicines may include:  Aspirin.  Medicine to lower cholesterol.  Medicine to control blood pressure. How to monitor  blood glucose   Check your blood glucose every day, as often as told by your health care provider.  Have your A1c (hemoglobin A1c) level checked two or more times a year, or as often as told by your health care provider. Your health care provider will set individualized treatment goals for you. Generally, the goal of treatment is to maintain the following blood glucose levels:  Before meals (preprandial): 80-130 mg/dL (4.4-7.2 mmol/L).  After meals (postprandial): below 180 mg/dL (10 mmol/L).  A1c level: less than 7%. How to manage hyperglycemia and hypoglycemia Hyperglycemia symptoms Hyperglycemia, also called high blood glucose, occurs when blood glucose is too high. Make sure you know the early signs of hyperglycemia, such as:  Increased thirst.  Hunger.  Feeling very tired.  Needing to urinate more often than usual.  Blurry vision. Hypoglycemia symptoms Hypoglycemia, also called low blood glucose, occurswith a blood glucose level at or below 70 mg/dL (3.9 mmol/L). The risk for hypoglycemia increases during or after exercise, during sleep, during illness, and when skipping meals or not eating for a long time (fasting). It is important to know the symptoms of hypoglycemia and treat it right away. Always have a 15-gram rapid-acting carbohydrate snack with you to treat low blood glucose. Family members and close friends should also know the symptoms and should understand how to treat hypoglycemia, in case you are not able to treat yourself. Symptoms may include:  Hunger.  Anxiety.  Sweating and feeling clammy.  Confusion.  Dizziness or feeling light-headed.  Sleepiness.  Nausea.  Increased heart rate.  Headache.  Blurry vision.  Irritability.  A change in coordination.  Tingling or numbness around the mouth, lips, or tongue.  Restless sleep.  Fainting.  Seizure. Treating hypoglycemia If you are alert and able to swallow safely, follow the 15:15  rule:  Take 15 grams of a rapid-acting carbohydrate. Talk with your health care provider about how much you should take.  Rapid-acting options include: ? Glucose pills (take 15 grams). ? 6-8 pieces of hard candy. ? 4-6 oz (120-150 mL) of fruit juice. ? 4-6 oz (120-150 mL) of regular (not diet) soda. ? 1 Tbsp (15 mL) honey or sugar.  Check your blood glucose 15 minutes after you take the carbohydrate.  If the repeat blood glucose level is still at or below 70 mg/dL (3.9 mmol/L), take 15 grams of a carbohydrate again.  If your blood glucose level does not increase above 70 mg/dL (3.9 mmol/L) after 3 tries, seek emergency medical care.  After your blood glucose level returns to normal, eat a meal or a snack within 1 hour. Treating severe hypoglycemia Severe hypoglycemia is when your blood glucose level is at or below 54 mg/dL (3 mmol/L). Severe hypoglycemia is an emergency. Do not wait to see if the symptoms will go away. Get medical help right away. Call your local emergency services (911 in the [place] If you have severe hypoglycemia and you cannot eat or drink, you may need an injection of glucagon. A family member or close  friend should learn how to check your blood glucose and how to give you a glucagon injection. Ask your health care provider if you need to have an emergency glucagon injection kit available. Severe hypoglycemia may need to be treated in a hospital. The treatment may include getting glucose through an IV. You may also need treatment for the cause of your hypoglycemia. Follow these instructions at home: Take diabetes medicines as told  If your health care provider prescribed insulin or diabetes medicines, take them every day.  Do not run out of insulin or other diabetes medicines that you take. Plan ahead so you always have these available.  If you use insulin, adjust your dosage based on how physically active you are and what foods you eat. Your health care provider  will tell you how to adjust your dosage. Make healthy food choices  The things that you eat and drink affect your blood glucose and your insulin dosage. Making good choices helps to control your diabetes and prevent other health problems. A healthy meal plan includes eating lean proteins, complex carbohydrates, fresh fruits and vegetables, low-fat dairy products, and healthy fats. Make an appointment to see a diet and nutrition specialist (registered dietitian) to help you create an eating plan that is right for you. Make sure that you:  Follow instructions from your health care provider about eating or drinking restrictions.  Drink enough fluid to keep your urine pale yellow.  Keep a record of the carbohydrates that you eat. Do this by reading food labels and learning the standard serving sizes of foods.  Follow your sick day plan whenever you cannot eat or drink as usual. Make this plan in advance with your health care provider.  Stay active Exercise regularly, as told by your health care provider. This may include:  Stretching and doing strength exercises, such as yoga or weightlifting, 2 or more times a week.  Doing 150 minutes or more of moderate-intensity or vigorous-intensity exercise each week. This could be brisk walking, biking, or water aerobics. ? Spread out your activity over 3 or more days of the week. ? Do not go more than 2 days in a row without doing some kind of physical activity. When you start a new exercise or activity, work with your health care provider to adjust your insulin, medicines, or food intake as needed. Make healthy lifestyle choices  Do not use any tobacco products, such as cigarettes, chewing tobacco, and e-cigarettes. If you need help quitting, ask your health care provider.  If your health care provider says that alcohol is safe for you, limit alcohol intake to no more than 1 drink per day for nonpregnant women and 2 drinks per day for men. One drink  equals 12 oz of beer (355 mL), 5 oz of wine (148 mL), or 1 oz of hard liquor (44 mL).  Learn to manage stress. If you need help with this, ask your health care provider. Care for your body   Keep your immunizations up to date. In addition to getting vaccinations as told by your health care provider, it is recommended that you get vaccinated against the following illnesses: ? The flu (influenza). Get a flu shot every year. ? Pneumonia. ? Hepatitis B.  Schedule an eye exam soon after your diagnosis, and then one time every year after that.  Check your skin and feet every day for cuts, bruises, redness, blisters, or sores. Schedule a foot exam with your health care provider once every year.  Brush your teeth and gums two times a day, and floss one or more times a day. Visit your dentist one or more times every 6 months.  Maintain a healthy weight. General instructions  Take over-the-counter and prescription medicines only as told by your health care provider.  Share your diabetes management plan with people in your workplace, school, and household.  Carry a medical alert card or wear medical alert jewelry.  Keep all follow-up visits as told by your health care provider. This is important. Questions to ask your health care provider  Do I need to meet with a diabetes educator?  Where can I find a support group for people with diabetes? Where to find more information For more information about diabetes, visit:  American Diabetes Association (ADA): www.diabetes.org  American Association of Diabetes Educators (AADE): www.diabeteseducator.org Summary  Caring for yourself after you have been diagnosed with (type 2 diabetes mellitus) means keeping your blood sugar (glucose) under control with a balance of nutrition, exercise, lifestyle changes, and medicine.  Check your blood glucose every day, as often as told by your health care provider.  Having diabetes can put you at risk for  other long-term (chronic) conditions, such as heart disease and kidney disease. Your health care provider may prescribe medicines to help prevent complications from diabetes.  Keep all follow-up visits as told by your health care provider. This is important. This information is not intended to replace advice given to you by your health care provider. Make sure you discuss any questions you have with your health care provider. Document Revised: 07/12/2017 Document Reviewed: 02/22/2015 Elsevier Patient Education  [place] [facility]  Get These Tests  Blood Pressure- Have your blood pressure checked by your healthcare provider at least once a year.  Normal blood pressure is 120/80.  Weight- Have your body mass index (BMI) calculated to screen for obesity.  BMI is a measure of body fat based on height and weight.  You can calculate your own BMI at [employer]  Cholesterol- Have your cholesterol checked every year.  Diabetes- Have your blood sugar checked every year if you have high blood pressure, high cholesterol, a family history of diabetes or if you are overweight.  Pap Test - Have a pap test every 1 to 5 years if you have been sexually active.  If you are older than 65 and recent pap tests have been normal you may not need additional pap tests.  In addition, if you have had a hysterectomy  for benign disease additional pap tests are not necessary.  Mammogram-Yearly mammograms are essential for early detection of breast cancer  Screening for Colon Cancer- Colonoscopy starting at age 24. Screening may begin sooner depending on your family history and other health conditions.  Follow up colonoscopy as directed by your Gastroenterologist.  Screening for Osteoporosis- Screening begins at age 59 with bone density scanning, sooner if you are at higher risk for developing Osteoporosis.  Get these medicines  Calcium with Vitamin D- Your body requires 1200-1500  mg of Calcium a day and [phone] IU of Vitamin D a day.  You can only absorb 500 mg of Calcium at a time therefore Calcium must be taken in 2 or 3 separate doses throughout the day.  Hormones- Hormone therapy has been associated with increased risk for certain cancers and heart disease.  Talk to your healthcare provider about if you need relief from menopausal symptoms.  Aspirin- Ask your healthcare provider about taking Aspirin  to prevent Heart Disease and Stroke.  Get these Immuniztions  Flu shot- Every fall  Pneumonia shot- Once after the age of 99; if you are younger ask your healthcare provider if you need a pneumonia shot.  Tetanus- Every ten years.  Zostavax- Once after the age of 58 to prevent shingles.  Take these steps  Don't smoke- Your healthcare provider can help you quit. For tips on how to quit, ask your healthcare provider or go to www.smokefree.gov or call 1-800 QUIT-NOW.  Be physically active- Exercise 5 days a week for a minimum of 30 minutes.  If you are not already physically active, start slow and gradually work up to 30 minutes of moderate physical activity.  Try walking, dancing, bike riding, swimming, etc.  Eat a healthy diet- Eat a variety of healthy foods such as fruits, vegetables, whole grains, low fat milk, low fat cheeses, yogurt, lean meats, chicken, fish, eggs, dried beans, tofu, etc.  For more information go to www.thenutritionsource.org  Dental visit- Brush and floss teeth twice daily; visit your dentist twice a year.  Eye exam- Visit your Optometrist or Ophthalmologist yearly.  Drink alcohol in moderation- Limit alcohol intake to one drink or less a day.  Never drink and drive.  Depression- Your emotional health is as important as your physical health.  If you're feeling down or losing interest in things you normally enjoy, please talk to your healthcare provider.  Seat Belts- can save your life; always wear one  Smoke/Carbon Monoxide detectors-  These detectors need to be installed on the appropriate level of your home.  Replace batteries at least once a year.  Violence- If anyone is threatening or hurting you, please tell your healthcare provider.  Living Will/ Health care power of attorney- Discuss with your healthcare provider and family.  If you have lab work done today you will be contacted with your lab results within the next 2 weeks.  If you have not heard from [place] then please contact us. The fastest way to get your results is to register for My Chart.   IF you received an x-ray today, you will receive an invoice from [facility]. Please contact [facility] at [phone] with questions or concerns regarding your invoice.   IF you received labwork today, you will receive an invoice from [facility]. Please contact [facility] at [phone] with questions or concerns regarding your invoice.   Our billing staff will not be able to assist you with questions regarding bills from these companies.  You will be contacted with the lab results as soon as they are available. The fastest way to get your results is to activate your My Chart account. Instructions are located on the last page of this paperwork. If you have not heard from [place] regarding the results in 2 weeks, please contact this office.         Signed, Merri Ray, MD [facility]

## 2019-03-17 LAB — CBC
Hematocrit: 42.1 % (ref 34.0–46.6)
Hemoglobin: 13.7 g/dL (ref 11.1–15.9)
MCH: 26.8 pg (ref 26.6–33.0)
MCHC: 32.5 g/dL (ref 31.5–35.7)
MCV: 82 fL (ref 79–97)
Platelets: 243 10*3/uL (ref 150–450)
RBC: 5.12 x10E6/uL (ref 3.77–5.28)
RDW: 12.8 % (ref 11.7–15.4)
WBC: 5.9 10*3/uL (ref 3.4–10.8)

## 2019-03-17 LAB — LIPID PANEL
Chol/HDL Ratio: 2.5 ratio (ref 0.0–4.4)
Cholesterol, Total: 124 mg/dL (ref 100–199)
HDL: 50 mg/dL (ref >39)
LDL Chol Calc (NIH): 55 mg/dL (ref 0–99)
Triglycerides: 100 mg/dL (ref 0–149)
VLDL Cholesterol Cal: 19 mg/dL (ref 5–40)

## 2019-03-17 LAB — COMPREHENSIVE METABOLIC PANEL
ALT: 20 IU/L (ref 0–32)
AST: 20 IU/L (ref 0–40)
Albumin/Globulin Ratio: 1.3 (ref 1.2–2.2)
Albumin: 4.4 g/dL (ref 3.8–4.9)
Alkaline Phosphatase: 94 IU/L (ref 39–117)
BUN/Creatinine Ratio: 16 (ref 9–23)
BUN: 15 mg/dL (ref 6–24)
Bilirubin Total: 0.3 mg/dL (ref 0.0–1.2)
CO2: 24 mmol/L (ref 20–29)
Calcium: 9.9 mg/dL (ref 8.7–10.2)
Chloride: 103 mmol/L (ref 96–106)
Creatinine, Ser: 0.94 mg/dL (ref 0.57–1.00)
GFR calc Af Amer: 80 mL/min/{1.73_m2} (ref >59)
GFR calc non Af Amer: 69 mL/min/{1.73_m2} (ref >59)
Globulin, Total: 3.4 g/dL (ref 1.5–4.5)
Glucose: 180 mg/dL — ABNORMAL HIGH (ref 65–99)
Potassium: 4.1 mmol/L (ref 3.5–5.2)
Sodium: 140 mmol/L (ref 134–144)
Total Protein: 7.8 g/dL (ref 6.0–8.5)

## 2019-03-17 LAB — HEMOGLOBIN A1C
Est. average glucose Bld gHb Est-mCnc: 197 mg/dL
Hgb A1c MFr Bld: 8.5 % — ABNORMAL HIGH (ref 4.8–5.6)

## 2019-03-17 LAB — TSH: TSH: 1.97 u[IU]/mL (ref 0.450–4.500)

## 2019-03-19 ENCOUNTER — Encounter: Payer: Self-pay | Admitting: Family Medicine

## 2019-03-29 ENCOUNTER — Other Ambulatory Visit: Payer: Self-pay

## 2019-03-29 ENCOUNTER — Encounter: Payer: Self-pay | Admitting: Family Medicine

## 2019-03-29 ENCOUNTER — Ambulatory Visit: Payer: 59 | Admitting: Family Medicine

## 2019-03-29 VITALS — BP 130/80 | HR 77 | Temp 98.3°F | Ht 66.0 in | Wt 194.0 lb

## 2019-03-29 DIAGNOSIS — R5383 Other fatigue: Secondary | ICD-10-CM

## 2019-03-29 DIAGNOSIS — G4700 Insomnia, unspecified: Secondary | ICD-10-CM | POA: Diagnosis not present

## 2019-03-29 DIAGNOSIS — M545 Low back pain, unspecified: Secondary | ICD-10-CM

## 2019-03-29 DIAGNOSIS — E1165 Type 2 diabetes mellitus with hyperglycemia: Secondary | ICD-10-CM

## 2019-03-29 NOTE — Patient Instructions (Addendum)
  Im glad to hear symptoms are improving. You have the option for taking Metformin 1000 mg at night as well if blood sugars remain elevated.  Make sure to drink plenty of fluids to minimize headaches, but if those are worsening or more frequent, follow-up to discuss further.  Frequent change in positions when biking or occasional breaks with stretching can be helpful for low back pain.  If that persists, follow-up to discuss further.  Make sure you are stretching after exercising.    If you have lab work done today you will be contacted with your lab results within the next 2 weeks.  If you have not heard from [place] then please contact us. The fastest way to get your results is to register for My Chart.   IF you received an x-ray today, you will receive an invoice from [facility]. Please contact [facility] at [phone] with questions or concerns regarding your invoice.   IF you received labwork today, you will receive an invoice from [place]. Please contact [facility] at [phone] with questions or concerns regarding your invoice.   Our billing staff will not be able to assist you with questions regarding bills from these companies.  You will be contacted with the lab results as soon as they are available. The fastest way to get your results is to activate your My Chart account. Instructions are located on the last page of this paperwork. If you have not heard from [place] regarding the results in 2 weeks, please contact this office.

## 2019-03-29 NOTE — Progress Notes (Signed)
Subjective:  Patient ID: Robin Delacruz, female    DOB: [birth year]  Age: 54 y.o. MRN: [id number]  CC:  Chief Complaint  Patient presents with  . Follow-up    on FATIGUE, SLEEP, BACK PAIN NIGHT SWEATS AND FLASHES. pt sttates she is still having these issues, but they have gotten slightly better since last visit. pt also reports her fatigue is work related.    HPI Robin Delacruz presents for follow up from physical and other concerns.   Hot Flushes: Handout given last visit. Improved with higher dose of metformin - thought to be due to high sugars.  Less night sweats.   Fatigue: See last visit. Thought to have stress component. Labs reassuring. Melatonin discussed for sleep if needed.  Had some days off - better. .not used melatonin, sleep is better.  Wakes up at 2 am at times Glucose 150-170.  metformin 1000mg  in am, 500 at night.  Home readings 109-211. No lows. Some elevated readings after dinner.  Low back pain Thought to be mechanical at last visit. Symptomatic care discussed. Imaging deferred at that time. Though to be due to riding bike, better with change in grip. Yoga and stretching helps.    History Patient Active Problem List   Diagnosis Date Noted  . Type 2 diabetes mellitus without complication ([place]) 79/03/4095  . Ovarian cyst   . Anemia   . Dyspnea   . Fatigue   . Chest pain 11/02/2011  . Hypertension    Past Medical History:  Diagnosis Date  . Allergy    seasonal  . Anemia   . Chest pain   . Diabetes mellitus    Diagnosed at age 38  . Dyspnea   . Fatigue   . Hypertension   . Ovarian cyst    Past Surgical History:  Procedure Laterality Date  . ABLATION    . CESAREAN SECTION    . TONSILLECTOMY    . TUBAL LIGATION     Allergies  Allergen Reactions  . Artichoke [Cynara Scolymus (Artichoke)] Hives  . Codeine Anaphylaxis and Hives  . Penicillins Anaphylaxis and Hives  . Sulfa Antibiotics Anaphylaxis and Hives   Prior to Admission  medications   Medication Sig Start Date End Date Taking? Authorizing Provider  albuterol (PROVENTIL HFA;VENTOLIN HFA) 108 (90 Base) MCG/ACT inhaler Inhale 1-2 puffs into the lungs every 4 (four) hours as needed for wheezing or shortness of breath (cough, shortness of breath or wheezing.). 06/05/16  Yes Wendie Agreste, MD  amLODipine (NORVASC) 5 MG tablet Take 1 tablet (5 mg total) by mouth daily. 03/16/19  Yes Wendie Agreste, MD  aspirin EC 81 MG tablet Take 1 tablet (81 mg total) by mouth daily. 11/02/11  Yes Lelon Perla, MD  atorvastatin (LIPITOR) 20 MG tablet Take 1 tablet (20 mg total) by mouth daily. 03/16/19  Yes Wendie Agreste, MD  benazepril (LOTENSIN) 40 MG tablet Take 1 tablet (40 mg total) by mouth daily. 03/16/19  Yes Wendie Agreste, MD  metFORMIN (GLUCOPHAGE) 500 MG tablet 2 tabs by mouth in am with meal, 1 tab by mouth at evening meal. 03/16/19  Yes Wendie Agreste, MD   Social History   Socioeconomic History  . Marital status: Divorced    Spouse name: Not on file  . Number of children: 2  . Years of education: Not on file  . Highest education level: Not on file  Occupational History  . Occupation: [employer]  Tobacco Use  . Smoking status: Never Smoker  . Smokeless tobacco: Never Used  Substance and Sexual Activity  . Alcohol use: No    Alcohol/week: 0.0 standard drinks  . Drug use: No  . Sexual activity: Never  Other Topics Concern  . Not on file  Social History Narrative  . Not on file   Social Determinants of Health   Financial Resource Strain:   . Difficulty of Paying Living Expenses: Not on file  Food Insecurity:   . Worried About Programme researcher, broadcasting/film/video in the Last Year: Not on file  . Ran Out of Food in the Last Year: Not on file  Transportation Needs:   . Lack of Transportation (Medical): Not on file  . Lack of Transportation (Non-Medical): Not on file  Physical Activity:   . Days of Exercise per Week: Not on  file  . Minutes of Exercise per Session: Not on file  Stress:   . Feeling of Stress : Not on file  Social Connections:   . Frequency of Communication with Friends and Family: Not on file  . Frequency of Social Gatherings with Friends and Family: Not on file  . Attends Religious Services: Not on file  . Active Member of Clubs or Organizations: Not on file  . Attends Banker Meetings: Not on file  . Marital Status: Not on file  Intimate Partner Violence:   . Fear of Current or Ex-Partner: Not on file  . Emotionally Abused: Not on file  . Physically Abused: Not on file  . Sexually Abused: Not on file    Review of Systems  Per HPI.  Objective:   Vitals:   03/29/19 1339 03/29/19 1343  BP: (!) 149/84 130/80  Pulse: 77   Temp: 98.3 F (36.8 C)   TempSrc: Temporal   SpO2: 96%   Weight: 194 lb (88 kg)   Height: 5\' 6"  (1.676 m)      Physical Exam Constitutional:      General: She is not in acute distress.    Appearance: She is well-developed.  HENT:     Head: Normocephalic and atraumatic.  Cardiovascular:     Rate and Rhythm: Normal rate and regular rhythm.  Pulmonary:     Effort: Pulmonary effort is normal.     Breath sounds: Normal breath sounds.  Neurological:     Mental Status: She is alert and oriented to person, place, and time.  Psychiatric:        Mood and Affect: Mood normal.        Behavior: Behavior normal.        Thought Content: Thought content normal.      Assessment & Plan:  Robin Delacruz is a 54 y.o. female . Type 2 diabetes mellitus with hyperglycemia, without long-term current use of insulin (HCC)  -Improving readings with higher Metformin dose in the morning, anticipate evening increased dose may be needed as well.  Potentially could add SGLT2 or GLP-1.  Recheck 3 months.  -Nighttime flushing symptoms improved, potentially could be related to her previous hyperglycemia.  RTC precautions if persistent or worsening  Insomnia,  unspecified type Fatigue, unspecified type  -Improved with change in work and time off.  Continue exercise, melatonin if needed.  Bilateral low back pain without sciatica, unspecified chronicity  -May have been related to position with bike use, now improved with change in positioning.  Stretches, range of motion during exercise discussed and the importance of stretching after  exercise with RTC precautions  No orders of the defined types were placed in this encounter.  Patient Instructions    Im glad to hear symptoms are improving. You have the option for taking Metformin 1000 mg at night as well if blood sugars remain elevated.  Make sure to drink plenty of fluids to minimize headaches, but if those are worsening or more frequent, follow-up to discuss further.  Frequent change in positions when biking or occasional breaks with stretching can be helpful for low back pain.  If that persists, follow-up to discuss further.  Make sure you are stretching after exercising.    If you have lab work done today you will be contacted with your lab results within the next 2 weeks.  If you have not heard from [place] then please contact us. The fastest way to get your results is to register for My Chart.   IF you received an x-ray today, you will receive an invoice from [facility]. Please contact [facility] at [phone] with questions or concerns regarding your invoice.   IF you received labwork today, you will receive an invoice from [place]. Please contact [facility] at [phone] with questions or concerns regarding your invoice.   Our billing staff will not be able to assist you with questions regarding bills from these companies.  You will be contacted with the lab results as soon as they are available. The fastest way to get your results is to activate your My Chart account. Instructions are located on the last page of this paperwork. If you have not heard from [place] regarding the  results in 2 weeks, please contact this office.         Signed, Meredith Staggers, MD [facility]

## 2019-04-13 ENCOUNTER — Other Ambulatory Visit: Payer: Self-pay

## 2019-04-13 ENCOUNTER — Encounter: Payer: Self-pay | Admitting: Family Medicine

## 2019-04-13 ENCOUNTER — Telehealth (INDEPENDENT_AMBULATORY_CARE_PROVIDER_SITE_OTHER): Payer: 59 | Admitting: Family Medicine

## 2019-04-13 VITALS — BP 145/85 | HR 94 | Temp 98.4°F | Ht 66.0 in | Wt 192.0 lb

## 2019-04-13 DIAGNOSIS — R0981 Nasal congestion: Secondary | ICD-10-CM | POA: Diagnosis not present

## 2019-04-13 DIAGNOSIS — J029 Acute pharyngitis, unspecified: Secondary | ICD-10-CM

## 2019-04-13 DIAGNOSIS — J039 Acute tonsillitis, unspecified: Secondary | ICD-10-CM | POA: Diagnosis not present

## 2019-04-13 MED ORDER — AZITHROMYCIN 250 MG PO TABS: ORAL_TABLET | ORAL | 0 refills | Status: DC

## 2019-04-13 NOTE — Patient Instructions (Addendum)
Start azithromycin for possible strep throat.  Fluids, rest, symptomatic care as needed with Tylenol for headache or body ache, that can also help sore throat.  See other information below.  I do recommend COVID-19 testing on Monday although less likely.  Also you can have some side effects after the initial vaccine but that is less likely at this time as well.  Return to the clinic or go to the nearest emergency room if any of your symptoms worsen or new symptoms occur.  To schedule Covid-19 testing at text "COVID" to [phone].  Another option is to log on to [email or web address] to make an on-line appointment.  If assistance needed with this process, please call [phone].    Sore Throat A sore throat is pain, burning, irritation, or scratchiness in the throat. When you have a sore throat, you may feel pain or tenderness in your throat when you swallow or talk. Many things can cause a sore throat, including:  An infection.  Seasonal allergies.  Dryness in the air.  Irritants, such as smoke or pollution.  Radiation treatment to the area.  Gastroesophageal reflux disease (GERD).  A tumor. A sore throat is often the first sign of another sickness. It may happen with other symptoms, such as coughing, sneezing, fever, and swollen neck glands. Most sore throats go away without medical treatment. Follow these instructions at home:      Take over-the-counter medicines only as told by your health care provider. ? If your child has a sore throat, do not give your child aspirin because of the association with Reye syndrome.  Drink enough fluids to keep your urine pale yellow.  Rest as needed.  To help with pain, try: ? Sipping warm liquids, such as broth, herbal tea, or warm water. ? Eating or drinking cold or frozen liquids, such as frozen ice pops. ? Gargling with a salt-water mixture 3-4 times a day or as needed. To make a salt-water mixture, completely dissolve -1 tsp (3-6 g)  of salt in 1 cup (237 mL) of warm water. ? Sucking on hard candy or throat lozenges. ? Putting a cool-mist humidifier in your bedroom at night to moisten the air. ? Sitting in the bathroom with the door closed for 5-10 minutes while you run hot water in the shower.  Do not use any products that contain nicotine or tobacco, such as cigarettes, e-cigarettes, and chewing tobacco. If you need help quitting, ask your health care provider.  Wash your hands well and often with soap and water. If soap and water are not available, use hand sanitizer. Contact a health care provider if:  You have a fever for more than 2-3 days.  You have symptoms that last (are persistent) for more than 2-3 days.  Your throat does not get better within 7 days.  You have a fever and your symptoms suddenly get worse.  Your child who is 3 months to 38 years old has a temperature of 102.14F (39C) or higher. Get help right away if:  You have difficulty breathing.  You cannot swallow fluids, soft foods, or your saliva.  You have increased swelling in your throat or neck.  You have persistent nausea and vomiting. Summary  A sore throat is pain, burning, irritation, or scratchiness in the throat. Many things can cause a sore throat.  Take over-the-counter medicines only as told by your health care provider. Do not give your child aspirin.  Drink plenty of fluids, and rest as needed.  Contact  a health care provider if your symptoms worsen or your sore throat does not get better within 7 days. This information is not intended to replace advice given to you by your health care provider. Make sure you discuss any questions you have with your health care provider. Document Revised: 06/21/2017 Document Reviewed: 06/21/2017 Elsevier Patient Education  [employer].   If you have lab work done today you will be contacted with your lab results within the next 2 weeks.  If you have not heard from [place] then please  contact us. The fastest way to get your results is to register for My Chart.   IF you received an x-ray today, you will receive an invoice from [facility]. Please contact [facility] at [phone] with questions or concerns regarding your invoice.   IF you received labwork today, you will receive an invoice from [place]. Please contact [facility] at [phone] with questions or concerns regarding your invoice.   Our billing staff will not be able to assist you with questions regarding bills from these companies.  You will be contacted with the lab results as soon as they are available. The fastest way to get your results is to activate your My Chart account. Instructions are located on the last page of this paperwork. If you have not heard from [place] regarding the results in 2 weeks, please contact this office.

## 2019-04-13 NOTE — Progress Notes (Signed)
Virtual Visit via Video Note  I connected with Robin Delacruz on 04/13/19 at 2:14 PM by a video enabled telemedicine application [employer] (multiple caregility attempts failed - 5 min prior) and verified that I am speaking with the correct person using two identifiers.   I discussed the limitations, risks, security and privacy concerns of performing an evaluation and management service by telephone and the availability of in person appointments. I also discussed with the patient that there may be a patient responsible charge related to this service. The patient expressed understanding and agreed to proceed, consent obtained  Chief complaint:  Chief Complaint  Patient presents with  . flu like syomptoms    pt states she woke up yesterday morning she was congested, trouble swolloing, sore throat, and dranage, pt has tried OVC cold and flu medication (tharaflu).    History of Present Illness: Robin Delacruz is a 54 y.o. female  Woke up yesterday am with sore throat, pain with swallowing. Min congestion, sore in ears. Sore to swallow throughout the night. Occasional radiation of discomfort to ear, popping, not pain. Raw sore throat main issue. Has noticed white patches on both side of thraot.  No fever.  No cough/dyspnea.  No change in taste/smell. No ha or bodyache.  Sneezing earlier this week with some allergy symptoms. None in past 2 days.  First dose of Pfizer vaccine 2 days ago.  Tx: otc theraflu, mucinex - temporary relief. Drinking fluids, tea with lemon, honey.      Patient Active Problem List   Diagnosis Date Noted  . Type 2 diabetes mellitus without complication ([place]) 64/40/3474  . Ovarian cyst   . Anemia   . Dyspnea   . Fatigue   . Chest pain 11/02/2011  . Hypertension    Past Medical History:  Diagnosis Date  . Allergy    seasonal  . Anemia   . Chest pain   . Diabetes mellitus    Diagnosed at age 71  . Dyspnea   . Fatigue   . Hypertension   . Ovarian  cyst    Past Surgical History:  Procedure Laterality Date  . ABLATION    . CESAREAN SECTION    . TONSILLECTOMY    . TUBAL LIGATION     Allergies  Allergen Reactions  . Artichoke [Cynara Scolymus (Artichoke)] Hives  . Codeine Anaphylaxis and Hives  . Penicillins Anaphylaxis and Hives  . Sulfa Antibiotics Anaphylaxis and Hives   Prior to Admission medications   Medication Sig Start Date End Date Taking? Authorizing Provider  albuterol (PROVENTIL HFA;VENTOLIN HFA) 108 (90 Base) MCG/ACT inhaler Inhale 1-2 puffs into the lungs every 4 (four) hours as needed for wheezing or shortness of breath (cough, shortness of breath or wheezing.). 06/05/16   Wendie Agreste, MD  amLODipine (NORVASC) 5 MG tablet Take 1 tablet (5 mg total) by mouth daily. 03/16/19   Wendie Agreste, MD  aspirin EC 81 MG tablet Take 1 tablet (81 mg total) by mouth daily. 11/02/11   Lelon Perla, MD  atorvastatin (LIPITOR) 20 MG tablet Take 1 tablet (20 mg total) by mouth daily. 03/16/19   Wendie Agreste, MD  benazepril (LOTENSIN) 40 MG tablet Take 1 tablet (40 mg total) by mouth daily. 03/16/19   Wendie Agreste, MD  metFORMIN (GLUCOPHAGE) 500 MG tablet 2 tabs by mouth in am with meal, 1 tab by mouth at evening meal. 03/16/19   Wendie Agreste, MD   Social History   Socioeconomic  History  . Marital status: Divorced    Spouse name: Not on file  . Number of children: 2  . Years of education: Not on file  . Highest education level: Not on file  Occupational History  . Occupation: MENTAL HEALTH    Employer: [employer]  Tobacco Use  . Smoking status: Never Smoker  . Smokeless tobacco: Never Used  Substance and Sexual Activity  . Alcohol use: No    Alcohol/week: 0.0 standard drinks  . Drug use: No  . Sexual activity: Never  Other Topics Concern  . Not on file  Social History Narrative  . Not on file   Social Determinants of Health   Financial Resource Strain:   . Difficulty of Paying Living  Expenses:   Food Insecurity:   . Worried About Programme researcher, broadcasting/film/video in the Last Year:   . Barista in the Last Year:   Transportation Needs:   . Freight forwarder (Medical):   Marland Kitchen Lack of Transportation (Non-Medical):   Physical Activity:   . Days of Exercise per Week:   . Minutes of Exercise per Session:   Stress:   . Feeling of Stress :   Social Connections:   . Frequency of Communication with Friends and Family:   . Frequency of Social Gatherings with Friends and Family:   . Attends Religious Services:   . Active Member of Clubs or Organizations:   . Attends Banker Meetings:   Marland Kitchen Marital Status:   Intimate Partner Violence:   . Fear of Current or Ex-Partner:   . Emotionally Abused:   Marland Kitchen Physically Abused:   . Sexually Abused:     Observations/Objective: Vitals:   04/13/19 1041  BP: (!) 145/85  Pulse: 94  Temp: 98.4 F (36.9 C)  TempSrc: Oral  Weight: 192 lb (87.1 kg)  Height: 5\' 6"  (1.676 m)  Nontoxic-appearing, speaking in full sentences, no respiratory distress.  Limited exam but over video was able to see posterior oropharynx, uvula is midline.  Brief look at lateral recesses, but out of focus.  She does note white exudate on both tonsils.  She is clearing secretions without difficulty.   Assessment and Plan: Sore throat - Plan: azithromycin (ZITHROMAX) 250 MG tablet  Nasal congestion  Exudative tonsillitis - Plan: azithromycin (ZITHROMAX) 250 MG tablet  Reported exudative tonsillitis, primarily sore throat with normal congestion.  Possible strep pharyngitis.  Less likely side effect from vaccine although that was within the past few days.  Less likely COVID-19, but plans on testing in 4 days.  Isolation precautions recommended during that time.  Symptomatic care discussed, start Z-Pak, Tylenol if needed, fluids, rest, RTC/ER precautions.  Follow Up Instructions:    I discussed the assessment and treatment plan with the patient. The  patient was provided an opportunity to ask questions and all were answered. The patient agreed with the plan and demonstrated an understanding of the instructions.   The patient was advised to call back or seek an in-person evaluation if the symptoms worsen or if the condition fails to improve as anticipated.  I provided 18 minutes of non-face-to-face time during this encounter.   , MD

## 2019-06-28 ENCOUNTER — Ambulatory Visit: Payer: 59 | Admitting: Family Medicine

## 2019-08-24 ENCOUNTER — Other Ambulatory Visit: Payer: Self-pay | Admitting: Family Medicine

## 2019-08-24 DIAGNOSIS — E119 Type 2 diabetes mellitus without complications: Secondary | ICD-10-CM

## 2019-10-26 ENCOUNTER — Telehealth: Payer: Self-pay | Admitting: *Deleted

## 2019-10-26 NOTE — Telephone Encounter (Signed)
Schedule mobile mammogram 10-30-2019 primary care at [place] 

## 2019-10-30 ENCOUNTER — Other Ambulatory Visit: Payer: Self-pay | Admitting: Family Medicine

## 2019-10-30 DIAGNOSIS — Z1231 Encounter for screening mammogram for malignant neoplasm of breast: Secondary | ICD-10-CM

## 2019-11-06 ENCOUNTER — Telehealth: Payer: Self-pay | Admitting: Family Medicine

## 2019-11-06 NOTE — Telephone Encounter (Signed)
  Called pt LVM to schedule this patient with Macario Carls Just at 11:00 [id number] per Dr. Haze Justin request

## 2019-11-13 ENCOUNTER — Ambulatory Visit: Payer: 59 | Admitting: Family Medicine

## 2019-11-16 ENCOUNTER — Other Ambulatory Visit: Payer: Self-pay

## 2019-11-16 ENCOUNTER — Ambulatory Visit: Payer: 59 | Admitting: Family Medicine

## 2019-11-16 VITALS — BP 143/87 | HR 85 | Temp 97.9°F | Ht 66.0 in | Wt 190.0 lb

## 2019-11-16 DIAGNOSIS — Z1159 Encounter for screening for other viral diseases: Secondary | ICD-10-CM

## 2019-11-16 DIAGNOSIS — Z23 Encounter for immunization: Secondary | ICD-10-CM | POA: Diagnosis not present

## 2019-11-16 DIAGNOSIS — E1169 Type 2 diabetes mellitus with other specified complication: Secondary | ICD-10-CM

## 2019-11-16 DIAGNOSIS — E119 Type 2 diabetes mellitus without complications: Secondary | ICD-10-CM

## 2019-11-16 DIAGNOSIS — E1165 Type 2 diabetes mellitus with hyperglycemia: Secondary | ICD-10-CM | POA: Diagnosis not present

## 2019-11-16 DIAGNOSIS — Z6830 Body mass index (BMI) 30.0-30.9, adult: Secondary | ICD-10-CM

## 2019-11-16 DIAGNOSIS — E785 Hyperlipidemia, unspecified: Secondary | ICD-10-CM | POA: Diagnosis not present

## 2019-11-16 DIAGNOSIS — I10 Essential (primary) hypertension: Secondary | ICD-10-CM | POA: Diagnosis not present

## 2019-11-16 LAB — POCT GLYCOSYLATED HEMOGLOBIN (HGB A1C): Hemoglobin A1C: 8 % — AB (ref 4.0–5.6)

## 2019-11-16 MED ORDER — AMLODIPINE BESYLATE 5 MG PO TABS: 5.0000 mg | ORAL_TABLET | Freq: Every day | ORAL | 2 refills | Status: DC

## 2019-11-16 MED ORDER — BENAZEPRIL HCL 40 MG PO TABS: 40.0000 mg | ORAL_TABLET | Freq: Every day | ORAL | 2 refills | Status: DC

## 2019-11-16 MED ORDER — ATORVASTATIN CALCIUM 20 MG PO TABS: 20.0000 mg | ORAL_TABLET | Freq: Every day | ORAL | 2 refills | Status: DC

## 2019-11-16 MED ORDER — METFORMIN HCL 500 MG PO TABS: ORAL_TABLET | ORAL | 0 refills | Status: DC

## 2019-11-16 NOTE — Progress Notes (Signed)
10/14/20214:25 PM  Robin Delacruz 1965-03-24, 54 y.o., female [id number]  Chief Complaint  Patient presents with  . Diabetes  . Hypertension    HPI:  Patient is a 54 y.o. female with past medical history significant for HTN, HLD, DM who presents today for follow up.  She has been busy at work as they have been short staffed. Feet pain from standing frequently at work,  trying different shoes for work and bought insoles . Alzheimer's: Father diagnosed age 62. Concerned for potential of genetic component. Her mother is primary caregiver.   Diabetes   -Improved home readings Started Keto Diet now seeing BG:80-112 Metformin 500 mg AM and 500 mg PM Potentially could add SGLT2 or GLP-1.  Recheck 3 months. 2/11: 8.5 Vision not blurry, no more night sweats Lab Results  Component Value Date   HGBA1C 8.0 (A) 11/16/2019    Hypertension Goal<140/90 Lifestyle modifications: Diet and Exercise Benazepril 72m daily Amlodipine 514mdaily Daily Aspirin 8164maily  BP Readings from Last 3 Encounters:  11/16/19 (!) 143/87  04/13/19 (!) 145/85  03/29/19 130/80    HLD Atorvastatin 73m21mily Stable at goal. No new myalgias Will recheck labs today  Lab Results  Component Value Date   CHOL 124 03/16/2019   HDL 50 03/16/2019   LDLCALC 55 03/16/2019   TRIG 100 03/16/2019   CHOLHDL 2.5 03/16/2019   BMI 30 Continuing to work on Diet and Exercise Wt Readings from Last 3 Encounters:  11/16/19 190 lb (86.2 kg)  04/13/19 192 lb (87.1 kg)  03/29/19 194 lb (88 kg)    Depression screen PHQ [facility] 11/16/2019 04/13/2019 03/29/2019  Decreased Interest 0 0 0  Down, Depressed, Hopeless 0 0 0  PHQ - 2 Score 0 0 0    Fall Risk  11/16/2019 04/13/2019 03/29/2019 03/16/2019 10/14/2017  Falls in the past year? 0 0 0 0 No  Number falls in past yr: 0 - 0 0 -  Injury with Fall? 0 - 0 0 -  Follow up Falls evaluation completed Falls evaluation completed Falls evaluation completed - -      Allergies  Allergen Reactions  . Artichoke [Cynara Scolymus (Artichoke)] Hives  . Codeine Anaphylaxis and Hives  . Penicillins Anaphylaxis and Hives  . Sulfa Antibiotics Anaphylaxis and Hives    Prior to Admission medications   Medication Sig Start Date End Date Taking? Authorizing Provider  amLODipine (NORVASC) 5 MG tablet Take 1 tablet (5 mg total) by mouth daily. 03/16/19  Yes Robin Delacruz  aspirin EC 81 MG tablet Take 1 tablet (81 mg total) by mouth daily. 11/02/11  Yes CrenLelon Delacruz  atorvastatin (LIPITOR) 20 MG tablet Take 1 tablet (20 mg total) by mouth daily. 03/16/19  Yes Robin Delacruz  benazepril (LOTENSIN) 40 MG tablet Take 1 tablet (40 mg total) by mouth daily. 03/16/19  Yes Robin Delacruz  metFORMIN (GLUCOPHAGE) 500 MG tablet TAKE 1 TABLET(500 MG) BY MOUTH TWICE DAILY WITH A MEAL 08/24/19  Yes Robin Delacruz    Past Medical History:  Diagnosis Date  . Allergy    seasonal  . Anemia   . Chest pain   . Diabetes mellitus    Diagnosed at age 57  53Dyspnea   . Fatigue   . Hypertension   . Ovarian cyst     Past Surgical History:  Procedure Laterality Date  . ABLATION    . CESAREAN SECTION    . TONSILLECTOMY    .  TUBAL LIGATION      Social History   Tobacco Use  . Smoking status: Never Smoker  . Smokeless tobacco: Never Used  Substance Use Topics  . Alcohol use: No    Alcohol/week: 0.0 standard drinks    Family History  Problem Relation Age of Onset  . Coronary artery disease Father        MI at age 67  . Diabetes Father   . Heart disease Father   . Hyperlipidemia Mother   . Diabetes Mother   . Thyroid disease Sister   . Thyroid disease Daughter   . Diabetes Maternal Grandmother   . Hypertension Maternal Grandmother   . Diabetes Paternal Grandmother   . Hyperlipidemia Paternal Grandmother   . Diabetes Paternal Grandfather   . Hyperlipidemia Paternal Grandfather   . Colon cancer Neg Hx     Review of Systems   Constitutional: Negative for chills and fever.  Eyes: Negative for blurred vision and double vision.  Respiratory: Negative for cough, shortness of breath and wheezing.   Cardiovascular: Negative for chest pain, palpitations and leg swelling.  Gastrointestinal: Negative for abdominal pain, blood in stool, constipation, diarrhea, heartburn, nausea and vomiting.  Genitourinary: Negative for dysuria, frequency and hematuria.  Musculoskeletal: Negative for back pain, joint pain and myalgias.  Skin: Negative for rash.  Neurological: Negative for dizziness, tingling, weakness and headaches.     OBJECTIVE:  Today's Vitals   11/16/19 1539 11/16/19 1553  BP: (!) 169/84 (!) 143/87  Pulse: 85   Temp: 97.9 F (36.6 C)   SpO2: 98%   Weight: 190 lb (86.2 kg)   Height: 5' 6"  (1.676 m)    Body mass index is 30.67 kg/m.   Physical Exam Vitals reviewed.  Constitutional:      General: She is not in acute distress.    Appearance: Normal appearance. She is not ill-appearing.  HENT:     Head: Normocephalic.  Eyes:     Pupils: Pupils are equal, round, and reactive to light.  Cardiovascular:     Rate and Rhythm: Normal rate and regular rhythm.     Pulses: Normal pulses.     Heart sounds: Normal heart sounds. No murmur heard.  No friction rub. No gallop.   Pulmonary:     Effort: Pulmonary effort is normal. No respiratory distress.     Breath sounds: No stridor. No wheezing, rhonchi or rales.  Abdominal:     General: Bowel sounds are normal.     Palpations: Abdomen is soft.  Skin:    General: Skin is warm and dry.  Neurological:     Mental Status: She is alert and oriented to person, place, and time.  Psychiatric:        Mood and Affect: Mood normal.     Results for orders placed or performed in visit on 11/16/19 (from the past 24 hour(s))  POCT glycosylated hemoglobin (Hb A1C)     Status: Abnormal   Collection Time: 11/16/19  4:25 PM  Result Value Ref Range   Hemoglobin A1C 8.0  (A) 4.0 - 5.6 %   HbA1c POC (<> result, manual entry)     HbA1c, POC (prediabetic range)     HbA1c, POC (controlled diabetic range)      No results found.   ASSESSMENT and PLAN  Problem List Items Addressed This Visit      Endocrine   Type 2 diabetes mellitus without complication ([place])   -Improved home readings Started Keto Diet now seeing  BG:80-112 Increase Metformin 1000 mg AM and 1000 mg PM (Previously 500 bid) Denies night sweats and vision changes A1C 8 down from 8.5, Potentially add SGLT2 or GLP-1.  Recheck 3 months. Relevant Medications   atorvastatin (LIPITOR) 20 MG tablet   benazepril (LOTENSIN) 40 MG tablet   metFORMIN (GLUCOPHAGE) 500 MG tablet   Other Relevant Orders   POCT glycosylated hemoglobin (Hb A1C)   Essential hypertension     Continue on Current Regimen Goal<140/90 Will follow up with lab results. Working on [employer] and exercise.   Relevant Medications   amLODipine (NORVASC) 5 MG tablet   atorvastatin (LIPITOR) 20 MG tablet   benazepril (LOTENSIN) 40 MG tablet   Other Relevant Orders   CMP14+EGFR   CBC   Hyperlipidemia, unspecified hyperlipidemia type     No new myalgias. Continue current Regimen will follow up with lab results.   Relevant Medications   amLODipine (NORVASC) 5 MG tablet   atorvastatin (LIPITOR) 20 MG tablet   benazepril (LOTENSIN) 40 MG tablet   Other Relevant Orders   Lipid panel   Need for hepatitis C screening test       Relevant Orders   Hepatitis C antibody   Encounter for immunization       Relevant Orders   Flu Vaccine QUAD 36+ mos IM   BMI 30.0-30.9,adult     Working to increase diet and exercise.     Screening Mammogram: Mobile unit November Pap: GYN Colon Cancer: 08/10/2016: Repeat colonoscopy in 10 years for screening purposes. Immuniz: Flu today Hep c screen: today Covid: Up to date   Return in about 3 months (around 02/16/2020) for medication follow up.   Huston Foley Robin Ferch, FNP-BC Primary Care at  [place], [place] [address] Ph.  [phone] Fax [phone]

## 2019-11-16 NOTE — Patient Instructions (Addendum)
Will follow up with lab results No medication changes at this time.     If you have lab work done today you will be contacted with your lab results within the next 2 weeks.  If you have not heard from [place] then please contact us. The fastest way to get your results is to register for My Chart.   IF you received an x-ray today, you will receive an invoice from [facility]. Please contact [facility] at [phone] with questions or concerns regarding your invoice.   IF you received labwork today, you will receive an invoice from [facility]. Please contact [facility] at [phone] with questions or concerns regarding your invoice.   Our billing staff will not be able to assist you with questions regarding bills from these companies.  You will be contacted with the lab results as soon as they are available. The fastest way to get your results is to activate your My Chart account. Instructions are located on the last page of this paperwork. If you have not heard from [place] regarding the results in 2 weeks, please contact this office.      Health Maintenance, Female Adopting a healthy lifestyle and getting preventive care are important in promoting health and wellness. Ask your health care provider about:  The right schedule for you to have regular tests and exams.  Things you can do on your own to prevent diseases and keep yourself healthy. What should I know about diet, weight, and exercise? Eat a healthy diet   Eat a diet that includes plenty of vegetables, fruits, low-fat dairy products, and lean protein.  Do not eat a lot of foods that are high in solid fats, added sugars, or sodium. Maintain a healthy weight Body mass index (BMI) is used to identify weight problems. It estimates body fat based on height and weight. Your health care provider can help determine your BMI and help you achieve or maintain a healthy weight. Get regular exercise Get regular exercise. This is  one of the most important things you can do for your health. Most adults should:  Exercise for at least 150 minutes each week. The exercise should increase your heart rate and make you sweat (moderate-intensity exercise).  Do strengthening exercises at least twice a week. This is in addition to the moderate-intensity exercise.  Spend less time sitting. Even light physical activity can be beneficial. Watch cholesterol and blood lipids Have your blood tested for lipids and cholesterol at 55 years of age, then have this test every 5 years. Have your cholesterol levels checked more often if:  Your lipid or cholesterol levels are high.  You are older than 55 years of age.  You are at high risk for heart disease. What should I know about cancer screening? Depending on your health history and family history, you may need to have cancer screening at various ages. This may include screening for:  Breast cancer.  Cervical cancer.  Colorectal cancer.  Skin cancer.  Lung cancer. What should I know about heart disease, diabetes, and high blood pressure? Blood pressure and heart disease  High blood pressure causes heart disease and increases the risk of stroke. This is more likely to develop in people who have high blood pressure readings, are of African descent, or are overweight.  Have your blood pressure checked: ? Every 3-5 years if you are 47-42 years of age. ? Every year if you are 63 years old or older. Diabetes Have regular diabetes screenings. This  checks your fasting blood sugar level. Have the screening done:  Once every three years after age 35 if you are at a normal weight and have a low risk for diabetes.  More often and at a younger age if you are overweight or have a high risk for diabetes. What should I know about preventing infection? Hepatitis B If you have a higher risk for hepatitis B, you should be screened for this virus. Talk with your health care provider to  find out if you are at risk for hepatitis B infection. Hepatitis C Testing is recommended for:  Everyone born from [birth year] through 1966.  Anyone with known risk factors for hepatitis C. Sexually transmitted infections (STIs)  Get screened for STIs, including gonorrhea and chlamydia, if: ? You are sexually active and are younger than 55 years of age. ? You are older than 55 years of age and your health care provider tells you that you are at risk for this type of infection. ? Your sexual activity has changed since you were last screened, and you are at increased risk for chlamydia or gonorrhea. Ask your health care provider if you are at risk.  Ask your health care provider about whether you are at high risk for HIV. Your health care provider may recommend a prescription medicine to help prevent HIV infection. If you choose to take medicine to prevent HIV, you should first get tested for HIV. You should then be tested every 3 months for as long as you are taking the medicine. Pregnancy  If you are about to stop having your period (premenopausal) and you may become pregnant, seek counseling before you get pregnant.  Take 400 to 800 micrograms (mcg) of folic acid every day if you become pregnant.  Ask for birth control (contraception) if you want to prevent pregnancy. Osteoporosis and menopause Osteoporosis is a disease in which the bones lose minerals and strength with aging. This can result in bone fractures. If you are 82 years old or older, or if you are at risk for osteoporosis and fractures, ask your health care provider if you should:  Be screened for bone loss.  Take a calcium or vitamin D supplement to lower your risk of fractures.  Be given hormone replacement therapy (HRT) to treat symptoms of menopause. Follow these instructions at home: Lifestyle  Do not use any products that contain nicotine or tobacco, such as cigarettes, e-cigarettes, and chewing tobacco. If you need help  quitting, ask your health care provider.  Do not use street drugs.  Do not share needles.  Ask your health care provider for help if you need support or information about quitting drugs. Alcohol use  Do not drink alcohol if: ? Your health care provider tells you not to drink. ? You are pregnant, may be pregnant, or are planning to become pregnant.  If you drink alcohol: ? Limit how much you use to 0-1 drink a day. ? Limit intake if you are breastfeeding.  Be aware of how much alcohol is in your drink. In the [place] one drink equals one 12 oz bottle of beer (355 mL), one 5 oz glass of wine (148 mL), or one 1 oz glass of hard liquor (44 mL). General instructions  Schedule regular health, dental, and eye exams.  Stay current with your vaccines.  Tell your health care provider if: ? You often feel depressed. ? You have ever been abused or do not feel safe at home. Summary  Adopting a  healthy lifestyle and getting preventive care are important in promoting health and wellness.  Follow your health care provider's instructions about healthy diet, exercising, and getting tested or screened for diseases.  Follow your health care provider's instructions on monitoring your cholesterol and blood pressure. This information is not intended to replace advice given to you by your health care provider. Make sure you discuss any questions you have with your health care provider. Document Revised: 01/12/2018 Document Reviewed: 01/12/2018 Elsevier Patient Education  2020 [employer].

## 2019-11-17 LAB — CBC
Hematocrit: 43.5 % (ref 34.0–46.6)
Hemoglobin: 14.1 g/dL (ref 11.1–15.9)
MCH: 26.3 pg — ABNORMAL LOW (ref 26.6–33.0)
MCHC: 32.4 g/dL (ref 31.5–35.7)
MCV: 81 fL (ref 79–97)
Platelets: 236 10*3/uL (ref 150–450)
RBC: 5.37 x10E6/uL — ABNORMAL HIGH (ref 3.77–5.28)
RDW: 13.2 % (ref 11.7–15.4)
WBC: 6 10*3/uL (ref 3.4–10.8)

## 2019-11-17 LAB — CMP14+EGFR
ALT: 18 IU/L (ref 0–32)
AST: 20 IU/L (ref 0–40)
Albumin/Globulin Ratio: 1.7 (ref 1.2–2.2)
Albumin: 4.7 g/dL (ref 3.8–4.9)
Alkaline Phosphatase: 105 IU/L (ref 44–121)
BUN/Creatinine Ratio: 24 — ABNORMAL HIGH (ref 9–23)
BUN: 23 mg/dL (ref 6–24)
Bilirubin Total: 0.2 mg/dL (ref 0.0–1.2)
CO2: 25 mmol/L (ref 20–29)
Calcium: 10 mg/dL (ref 8.7–10.2)
Chloride: 105 mmol/L (ref 96–106)
Creatinine, Ser: 0.97 mg/dL (ref 0.57–1.00)
GFR calc Af Amer: 76 mL/min/{1.73_m2} (ref >59)
GFR calc non Af Amer: 66 mL/min/{1.73_m2} (ref >59)
Globulin, Total: 2.8 g/dL (ref 1.5–4.5)
Glucose: 186 mg/dL — ABNORMAL HIGH (ref 65–99)
Potassium: 3.7 mmol/L (ref 3.5–5.2)
Sodium: 143 mmol/L (ref 134–144)
Total Protein: 7.5 g/dL (ref 6.0–8.5)

## 2019-11-17 LAB — LIPID PANEL
Chol/HDL Ratio: 3 ratio (ref 0.0–4.4)
Cholesterol, Total: 137 mg/dL (ref 100–199)
HDL: 46 mg/dL (ref >39)
LDL Chol Calc (NIH): 71 mg/dL (ref 0–99)
Triglycerides: 111 mg/dL (ref 0–149)
VLDL Cholesterol Cal: 20 mg/dL (ref 5–40)

## 2019-11-17 LAB — HEPATITIS C ANTIBODY: Hep C Virus Ab: 1.2 s/co ratio — ABNORMAL HIGH (ref 0.0–0.9)

## 2019-11-20 ENCOUNTER — Telehealth: Payer: Self-pay | Admitting: Family Medicine

## 2019-11-20 ENCOUNTER — Other Ambulatory Visit: Payer: Self-pay | Admitting: Family Medicine

## 2019-11-20 DIAGNOSIS — Z1159 Encounter for screening for other viral diseases: Secondary | ICD-10-CM

## 2019-11-20 NOTE — Telephone Encounter (Signed)
Pt called and stated that she would like a nurse to call her regarding her lab results. Please advise.

## 2019-11-24 ENCOUNTER — Telehealth: Payer: Self-pay

## 2019-11-24 ENCOUNTER — Other Ambulatory Visit: Payer: Self-pay

## 2019-11-24 ENCOUNTER — Encounter: Payer: Self-pay | Admitting: Internal Medicine

## 2019-11-24 ENCOUNTER — Ambulatory Visit (INDEPENDENT_AMBULATORY_CARE_PROVIDER_SITE_OTHER): Payer: 59 | Admitting: Internal Medicine

## 2019-11-24 VITALS — BP 133/82 | HR 72 | Temp 97.9°F | Ht 66.0 in | Wt 192.0 lb

## 2019-11-24 DIAGNOSIS — R768 Other specified abnormal immunological findings in serum: Secondary | ICD-10-CM

## 2019-11-24 NOTE — Assessment & Plan Note (Signed)
HCV Ab positive in patient without any current signs/symptoms of chronic liver disease.    Plan: -- Will check HCV RNA to confirm active infection -- If positive will arrange for further work up and treatment.  If negative no further work up needed -- RTC PRN pending lab results

## 2019-11-24 NOTE — Patient Instructions (Signed)
Thank you for coming to see me today. It was a pleasure seeing you.  Once we have the results of your lab results, I will let you know about what to do next in terms of labs or follow up.  If you have any questions or concerns, please do not hesitate to call the office at [phone].  Take Care,   Gwynn Burly, DO

## 2019-11-24 NOTE — Progress Notes (Signed)
[facility]  Reason for Consult: positive HCV Ab Referring Provider: Macario Carls Just, FNP  HPI:  Robin Delacruz is a 54 y.o. female who presents to clinic for positive HCV Ab test.     She was seen by her PCP office on 11/16/19 for routine follow up.  At that visit a HCV Ab screen was done and returned positive.  She has no history of injection or intranasal drug use.  No tattoos.  Her mother and grandmother have a history of treated hepatitis C.  Her ex-partner was a drug user and poses her biggest potential exposure risk, but they have not been together for several years.  She otherwise is healthy and has a past medical hx HTN, DM, and allergies.  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   Patient's Medications  New Prescriptions   No medications on file  Previous Medications   AMLODIPINE (NORVASC) 5 MG TABLET    Take 1 tablet (5 mg total) by mouth daily.   ASPIRIN EC 81 MG TABLET    Take 1 tablet (81 mg total) by mouth daily.   ATORVASTATIN (LIPITOR) 20 MG TABLET    Take 1 tablet (20 mg total) by mouth daily.   BENAZEPRIL (LOTENSIN) 40 MG TABLET    Take 1 tablet (40 mg total) by mouth daily.   METFORMIN (GLUCOPHAGE) 500 MG TABLET    TAKE 1 TABLET(500 MG) BY MOUTH TWICE DAILY WITH A MEAL  Modified Medications   No medications on file  Discontinued Medications   No medications on file      Past Medical History:  Diagnosis Date  . Allergy    seasonal  . Anemia   . Chest pain   . Diabetes mellitus    Diagnosed at age 53  . Dyspnea   . Fatigue   . Hypertension   . Ovarian cyst     Social History   Tobacco Use  . Smoking status: Never Smoker  . Smokeless tobacco: Never Used  Vaping Use  . Vaping Use: Never used  Substance Use Topics  . Alcohol use: No    Alcohol/week: 0.0 standard drinks  . Drug use: No    Family History   Problem Relation Age of Onset  . Coronary artery disease Father        MI at age 55  . Diabetes Father   . Heart disease Father   . Hyperlipidemia Mother   . Diabetes Mother   . Thyroid disease Sister   . Thyroid disease Daughter   . Diabetes Maternal Grandmother   . Hypertension Maternal Grandmother   . Diabetes Paternal Grandmother   . Hyperlipidemia Paternal Grandmother   . Diabetes Paternal Grandfather   . Hyperlipidemia Paternal Grandfather   . Colon cancer Neg Hx     Allergies  Allergen Reactions  . Artichoke [Cynara Scolymus (Artichoke)] Hives  . Codeine Anaphylaxis and Hives  . Penicillins Anaphylaxis and Hives  . Sulfa Antibiotics Anaphylaxis and Hives    Review of Systems: Review of Systems  Constitutional: Negative for chills and fever.  Respiratory: Negative.   Cardiovascular: Negative.   Gastrointestinal: Negative.   All other systems reviewed and are negative.    Objective: Vitals:   11/24/19 0833  BP: 133/82  Pulse: 72  Temp: 97.9 F (36.6 C)  TempSrc: Oral  SpO2: 97%  Weight: 192 lb (87.1 kg)  Height: 5\' 6"  (1.676 m)     Body mass index is 30.99 kg/m.  Physical Exam Constitutional:      General: She is not in acute distress.    Appearance: Normal appearance.  Cardiovascular:     Rate and Rhythm: Normal rate and regular rhythm.  Pulmonary:     Effort: Pulmonary effort is normal.     Breath sounds: Normal breath sounds.  Skin:    General: Skin is warm and dry.  Neurological:     General: No focal deficit present.     Mental Status: She is alert and oriented to person, place, and time.  Psychiatric:        Mood and Affect: Mood normal.        Behavior: Behavior normal.      Pertinent Labs and Microbiology  CBC Latest Ref Rng & Units 11/16/2019 03/16/2019 06/05/2016  WBC 3.4 - 10.8 x10E3/uL 6.0 5.9 5.6  Hemoglobin 11.1 - 15.9 g/dL 08/05/2016 78.2 95.6  Hematocrit 34.0 - 46.6 % 43.5 42.1 40.9  Platelets 150 - 450 x10E3/uL 236 243 253    CMP Latest Ref Rng & Units 11/16/2019 03/16/2019 06/23/2018  Glucose 65 - 99 mg/dL 06/25/2018) 086(V) 784(O)  BUN 6 - 24 mg/dL 23 15 22   Creatinine 0.57 - 1.00 mg/dL 962(X 5.28)  Sodium 134 - 144 mmol/L 143 140 142  Potassium 3.5 - 5.2 mmol/L 3.7 4.1 4.4  Chloride 96 - 106 mmol/L 105 103 102  CO2 20 - 29 mmol/L 25 24 26   Calcium 8.7 - 10.2 mg/dL 4.13 9.9 9.5  Total Protein 6.0 - 8.5 g/dL 7.5 7.8 6.8  Total Bilirubin 0.0 - 1.2 mg/dL 0.2 0.3 0.4  Alkaline Phos 44 - 121 IU/L 105 94 78  AST 0 - 40 IU/L 20 20 18   ALT 0 - 32 IU/L 18 20 16       Assessment and Plan: 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    Plan: -- Will check HCV RNA to confirm active infection -- If positive will arrange for further work up and treatment.  If negative no further work up needed -- RTC PRN pending lab results    Orders Placed This Encounter  Procedures  . Hepatitis C RNA quantitative        2.44([facility] 11/24/2019, 9:03 AM

## 2019-11-24 NOTE — Telephone Encounter (Signed)
[facility] Patient Product/process development scientist completed.    The patient is insured through [employer].   PA will be needed for Mavyret, Epclusa or Harvoni.  We will continue to follow to see if copay assistance is needed.  Clearance Coots, CPhT Specialty Pharmacy Patient [facility] Phone: [phone] Fax:  [phone]

## 2019-11-27 LAB — HEPATITIS C RNA QUANTITATIVE
HCV RNA, PCR, QN (Log): <1.18 log IU/mL
HCV RNA, PCR, QN: <15 IU/mL

## 2019-11-30 ENCOUNTER — Telehealth: Payer: Self-pay

## 2019-11-30 NOTE — Telephone Encounter (Signed)
-----   Message from Kathlynn Grate, DO sent at 11/28/2019  4:40 PM EDT ----- Hello Mrs Higham,  Your blood test showed that there was no active Hepatitis C viral replication which indicates if you had hepatitis C in the past you were able to clear the infection on your own.  There is no need for any further treatment since you are not chronically infected.  Please let me know if you have any questions/concerns.  --Gwynn Burly

## 2019-11-30 NOTE — Telephone Encounter (Signed)
Left message on voice mail   to call the office . Laurell Josephs, RN

## 2019-12-01 NOTE — Telephone Encounter (Signed)
Attempted to contact patient two separate times. Patient made aware of results via MyChart. And advised to contact the office with any further questions or concerns.  Robin Delacruz

## 2020-02-16 ENCOUNTER — Ambulatory Visit: Payer: 59 | Admitting: Family Medicine

## 2020-03-30 ENCOUNTER — Other Ambulatory Visit: Payer: Self-pay | Admitting: Family Medicine

## 2020-03-30 DIAGNOSIS — E119 Type 2 diabetes mellitus without complications: Secondary | ICD-10-CM

## 2020-03-30 DIAGNOSIS — E785 Hyperlipidemia, unspecified: Secondary | ICD-10-CM

## 2020-04-09 ENCOUNTER — Other Ambulatory Visit: Payer: Self-pay | Admitting: Family Medicine

## 2020-04-09 DIAGNOSIS — I10 Essential (primary) hypertension: Secondary | ICD-10-CM

## 2020-04-09 DIAGNOSIS — E119 Type 2 diabetes mellitus without complications: Secondary | ICD-10-CM

## 2020-04-26 ENCOUNTER — Ambulatory Visit: Payer: 59 | Admitting: Family Medicine

## 2020-04-26 ENCOUNTER — Encounter: Payer: Self-pay | Admitting: Family Medicine

## 2020-04-26 ENCOUNTER — Other Ambulatory Visit: Payer: Self-pay

## 2020-04-26 VITALS — BP 138/84 | HR 77 | Temp 98.0°F | Ht 66.0 in | Wt 189.0 lb

## 2020-04-26 DIAGNOSIS — E785 Hyperlipidemia, unspecified: Secondary | ICD-10-CM

## 2020-04-26 DIAGNOSIS — E119 Type 2 diabetes mellitus without complications: Secondary | ICD-10-CM | POA: Diagnosis not present

## 2020-04-26 DIAGNOSIS — I10 Essential (primary) hypertension: Secondary | ICD-10-CM | POA: Diagnosis not present

## 2020-04-26 MED ORDER — BENAZEPRIL HCL 40 MG PO TABS
ORAL_TABLET | ORAL | 1 refills | Status: DC
Start: 2020-04-26 — End: 2021-02-10

## 2020-04-26 MED ORDER — METFORMIN HCL 500 MG PO TABS: 500.0000 mg | ORAL_TABLET | Freq: Two times a day (BID) | ORAL | 1 refills | Status: DC

## 2020-04-26 MED ORDER — AMLODIPINE BESYLATE 5 MG PO TABS: 5.0000 mg | ORAL_TABLET | Freq: Every day | ORAL | 2 refills | Status: AC

## 2020-04-26 NOTE — Progress Notes (Signed)
Subjective:  Patient ID: Robin Delacruz, female    DOB: Jul 23, 1965  Age: 55 y.o. MRN: [id number]  CC:  Chief Complaint  Patient presents with  . Follow-up    On hypertension and diabetes. PT reports no issues with these conditions since last OV. Pt reports everything is the same since last OV.   HPI Robin Delacruz presents for   Diabetes: With hyperglycemia. Recommended Metformin 1000 mg in the morning, 500 mg at night. Did not tolerate 1000 mg dose, made her nauseous/felt sick.   Home readings from 109-211 last visit.  Option to add SGLT2 or GLP-1, plan for A1c today. Microalbumin: Normal ratio 06/23/2018 She is on statin, ACE inhibitor.  Home readings: fasting: 110-115.  Postprandial: 210. Sugars better on days off - better diet.  Working 14 hr days. Sometimes skips breakfast - 2 in afternoon. Takes metformin. Sweats, shaky at times, lightheaded in late morning - not checking blood sugar.  Skips some meals at times. Available snacks are not healthy. Optho, foot exam, pneumovax: Up-to-date  Working on new schedule.   Due for mammogram, Pap testing due - has obgyn, will schedule.   Lab Results  Component Value Date   HGBA1C 8.0 (A) 11/16/2019   HGBA1C 8.5 (H) 03/16/2019   HGBA1C 8.7 (A) 03/16/2019   Lab Results  Component Value Date   MICROALBUR 1.1 08/04/2014   LDLCALC 71 11/16/2019   CREATININE 0.97 11/16/2019   Hypertension: Benazepril 40 mg daily, amlodipine 5 mg daily Missed few days of meds few weeks ago - not recently.  No home readings.  No alcohol. Some salt in foods, no added salt.  BP Readings from Last 3 Encounters:  04/26/20 138/84  11/24/19 133/82  11/16/19 (!) 143/87   Lab Results  Component Value Date   CREATININE 0.97 11/16/2019      History Patient Active Problem List   Diagnosis Date Noted  . 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 Hyperlipidemia associated with type 2 diabetes mellitus ([place]) 11/16/2019  . BMI 30.0-30.9,adult  11/16/2019  . Type 2 diabetes mellitus without complication ([place]) 59/97/7414  . Ovarian cyst   . Anemia   . Fatigue   . Chest pain 11/02/2011  . Hypertension    Past Medical History:  Diagnosis Date  . Allergy    seasonal  . Anemia   . Chest pain   . Diabetes mellitus    Diagnosed at age 55  . Dyspnea   . Fatigue   . Hypertension   . Ovarian cyst    Past Surgical History:  Procedure Laterality Date  . ABLATION    . CESAREAN SECTION    . TONSILLECTOMY    . TUBAL LIGATION     Allergies  Allergen Reactions  . Artichoke [Cynara Scolymus (Artichoke)] Hives  . Codeine Anaphylaxis and Hives  . Penicillins Anaphylaxis and Hives  . Sulfa Antibiotics Anaphylaxis and Hives   Prior to Admission medications   Medication Sig Start Date End Date Taking? Authorizing Provider  amLODipine (NORVASC) 5 MG tablet Take 1 tablet (5 mg total) by mouth daily. 11/16/19  Yes Just, Laurita Quint, FNP  aspirin EC 81 MG tablet Take 1 tablet (81 mg total) by mouth daily. 11/02/11  Yes Lelon Perla, MD  atorvastatin (LIPITOR) 20 MG tablet TAKE 1 TABLET(20 MG) BY MOUTH DAILY 03/30/20  Yes Wendie Agreste, MD  benazepril (LOTENSIN) 40 MG tablet TAKE 1 TABLET(40 MG) BY MOUTH DAILY 04/09/20  Yes Wendie Agreste, MD  metFORMIN (GLUCOPHAGE)  500 MG tablet TAKE 1 TABLET(500 MG) BY MOUTH TWICE DAILY WITH A MEAL 11/16/19  Yes Just, Laurita Quint, FNP   Social History   Socioeconomic History  . Marital status: Divorced    Spouse name: Not on file  . Number of children: 2  . Years of education: Not on file  . Highest education level: Not on file  Occupational History  . Occupation: MENTAL HEALTH    Employer: [employer]  Tobacco Use  . Smoking status: Never Smoker  . Smokeless tobacco: Never Used  Vaping Use  . Vaping Use: Never used  Substance and Sexual Activity  . Alcohol use: No    Alcohol/week: 0.0 standard drinks  . Drug use: No  . Sexual activity: Never  Other Topics Concern  . Not on file   Social History Narrative  . Not on file   Social Determinants of Health   Financial Resource Strain: Not on file  Food Insecurity: Not on file  Transportation Needs: Not on file  Physical Activity: Not on file  Stress: Not on file  Social Connections: Not on file  Intimate Partner Violence: Not on file    Review of Systems Per HPI.   Objective:   Vitals:   04/26/20 0929 04/26/20 1013  BP: (!) 155/88 138/84  Pulse: 77   Temp: 98 F (36.7 C)   TempSrc: Temporal   SpO2: 97%   Weight: 189 lb (85.7 kg)   Height: 5' 6"  (1.676 m)      Physical Exam Vitals reviewed.  Constitutional:      Appearance: She is well-developed.  HENT:     Head: Normocephalic and atraumatic.  Eyes:     Conjunctiva/sclera: Conjunctivae normal.     Pupils: Pupils are equal, round, and reactive to light.  Neck:     Vascular: No carotid bruit.  Cardiovascular:     Rate and Rhythm: Normal rate and regular rhythm.     Heart sounds: Normal heart sounds.  Pulmonary:     Effort: Pulmonary effort is normal.     Breath sounds: Normal breath sounds.  Abdominal:     Palpations: Abdomen is soft. There is no pulsatile mass.     Tenderness: There is no abdominal tenderness.  Skin:    General: Skin is warm and dry.  Neurological:     Mental Status: She is alert and oriented to person, place, and time.  Psychiatric:        Behavior: Behavior normal.        Assessment & Plan:  Robin Delacruz is a 55 y.o. female . Hyperlipidemia, unspecified hyperlipidemia type - Plan: Comprehensive metabolic panel, Lipid panel  -Tolerating Lipitor, continue same, check labs  Essential hypertension - Plan: Comprehensive metabolic panel, benazepril (LOTENSIN) 40 MG tablet, amLODipine (NORVASC) 5 MG tablet  -Borderline but continue same regimen for now.  Improve diet may help.  Type 2 diabetes mellitus without complication, without long-term current use of insulin (HCC) - Plan: metFORMIN (GLUCOPHAGE) 500 MG  tablet, Hemoglobin A1c, Microalbumin / creatinine urine ratio, Amb ref to Medical Nutrition Therapy-MNT, benazepril (LOTENSIN) 40 MG tablet  -Discussed concerns of possible hypoglycemia during the day as well as hyperglycemia/decreased control based on prior A1c.  Understandably difficult with her current work hours and nutrition.  Will refer to nutritionist, recommended healthy snacks throughout the day.  Monitor for signs or symptoms of hypoglycemia and to alert me if those are recurring.  Continue same dose of Metformin for now, would be  hesitant to start new medications until more regular diet.  75-monthfollow-up.  Other health maintenance discussed as above. No orders of the defined types were placed in this encounter.  Patient Instructions   I am concerned about your diabetes control and diet with work.  Breakfast each day, healthy snacks as possible, but I will refer you to nutritionist as well.  Check blood sugar if you feel shaky/sweting. See precautions below.  Depending on A1c we can discuss changes, but not until less risk of low blood sugars.   We recommend that you schedule a mammogram for breast cancer screening. Typically, you do not need a referral to do this. Please contact a local imaging center to schedule your mammogram.  [facility] [phone] *ask for the Radiology [place] - ([phone] [facility] - [phone] - [phone] *[facility] the [facility]- ([phone] *ask for the Radiology Department [facility] - [phone] *ask for the [place]- ([phone]  Managing Your Hypertension Hypertension, also called high blood pressure, is when the force of the blood pressing against the walls of the arteries is too strong.  Arteries are blood vessels that carry blood from your heart throughout your body. Hypertension forces the heart to work harder to pump blood and may cause the arteries to become narrow or stiff. Understanding blood pressure readings Your personal target blood pressure may vary depending on your medical conditions, your age, and other factors. A blood pressure reading includes a higher number over a lower number. Ideally, your blood pressure should be below 120/80. You should know that:  The first, or top, number is called the systolic pressure. It is a measure of the pressure in your arteries as your heart beats.  The second, or bottom number, is called the diastolic pressure. It is a measure of the pressure in your arteries as the heart relaxes. Blood pressure is classified into four stages. Based on your blood pressure reading, your health care provider may use the following stages to determine what type of treatment you need, if any. Systolic pressure and diastolic pressure are measured in a unit called mmHg. Normal  Systolic pressure: below 1834  Diastolic pressure: below 80. Elevated  Systolic pressure: 1196-222  Diastolic pressure: below 80. Hypertension stage 1  Systolic pressure: 1979-892  Diastolic pressure: 811-94 Hypertension stage 2  Systolic pressure: 1174or above.  Diastolic pressure: 90 or above. How can this condition affect me? Managing your hypertension is an important responsibility. Over time, hypertension can damage the arteries and decrease blood flow to important parts of the body, including the brain, heart, and kidneys. Having untreated or uncontrolled hypertension can lead to:  A heart attack.  A stroke.  A weakened blood vessel (aneurysm).  Heart failure.  Kidney damage.  Eye damage.  Metabolic syndrome.  Memory and concentration problems.  Vascular dementia. What actions can I take to manage this condition? Hypertension can be managed by  making lifestyle changes and possibly by taking medicines. Your health care provider will help you make a plan to bring your blood pressure within a normal range. Nutrition  Eat a diet that is high in fiber and potassium, and low in salt (sodium), added sugar, and fat. An example eating plan is called the Dietary Approaches to Stop  Hypertension (DASH) diet. To eat this way: ? Eat plenty of fresh fruits and vegetables. Try to fill one-half of your plate at each meal with fruits and vegetables. ? Eat whole grains, such as whole-wheat pasta, brown rice, or whole-grain bread. Fill about one-fourth of your plate with whole grains. ? Eat low-fat dairy products. ? Avoid fatty cuts of meat, processed or cured meats, and poultry with skin. Fill about one-fourth of your plate with lean proteins such as fish, chicken without skin, beans, eggs, and tofu. ? Avoid pre-made and processed foods. These tend to be higher in sodium, added sugar, and fat.  Reduce your daily sodium intake. Most people with hypertension should eat less than 1,500 mg of sodium a day.   Lifestyle  Work with your health care provider to maintain a healthy body weight or to lose weight. Ask what an ideal weight is for you.  Get at least 30 minutes of exercise that causes your heart to beat faster (aerobic exercise) most days of the week. Activities may include walking, swimming, or biking.  Include exercise to strengthen your muscles (resistance exercise), such as weight lifting, as part of your weekly exercise routine. Try to do these types of exercises for 30 minutes at least 3 days a week.  Do not use any products that contain nicotine or tobacco, such as cigarettes, e-cigarettes, and chewing tobacco. If you need help quitting, ask your health care provider.  Control any long-term (chronic) conditions you have, such as high cholesterol or diabetes.  Identify your sources of stress and find ways to manage stress. This may include  meditation, deep breathing, or making time for fun activities.   Alcohol use  Do not drink alcohol if: ? Your health care provider tells you not to drink. ? You are pregnant, may be pregnant, or are planning to become pregnant.  If you drink alcohol: ? Limit how much you use to:  0-1 drink a day for women.  0-2 drinks a day for men. ? Be aware of how much alcohol is in your drink. In the [place] one drink equals one 12 oz bottle of beer (355 mL), one 5 oz glass of wine (148 mL), or one 1 oz glass of hard liquor (44 mL). Medicines Your health care provider may prescribe medicine if lifestyle changes are not enough to get your blood pressure under control and if:  Your systolic blood pressure is 130 or higher.  Your diastolic blood pressure is 80 or higher. Take medicines only as told by your health care provider. Follow the directions carefully. Blood pressure medicines must be taken as told by your health care provider. The medicine does not work as well when you skip doses. Skipping doses also puts you at risk for problems. Monitoring Before you monitor your blood pressure:  Do not smoke, drink caffeinated beverages, or exercise within 30 minutes before taking a measurement.  Use the bathroom and empty your bladder (urinate).  Sit quietly for at least 5 minutes before taking measurements. Monitor your blood pressure at home as told by your health care provider. To do this:  Sit with your back straight and supported.  Place your feet flat on the floor. Do not cross your legs.  Support your arm on a flat surface, such as a table. Make sure your upper arm is at heart level.  Each time you measure, take two or three readings one minute apart and record the results. You may also need to have  your blood pressure checked regularly by your health care provider.   General information  Talk with your health care provider about your diet, exercise habits, and other lifestyle factors  that may be contributing to hypertension.  Review all the medicines you take with your health care provider because there may be side effects or interactions.  Keep all visits as told by your health care provider. Your health care provider can help you create and adjust your plan for managing your high blood pressure. Where to find more information  National Heart, Lung, and Blood Institute: https://wilson-eaton.com/  American Heart Association: www.heart.org Contact a health care provider if:  You think you are having a reaction to medicines you have taken.  You have repeated (recurrent) headaches.  You feel dizzy.  You have swelling in your ankles.  You have trouble with your vision. Get help right away if:  You develop a severe headache or confusion.  You have unusual weakness or numbness, or you feel faint.  You have severe pain in your chest or abdomen.  You vomit repeatedly.  You have trouble breathing. These symptoms may represent a serious problem that is an emergency. Do not wait to see if the symptoms will go away. Get medical help right away. Call your local emergency services (911 in the [place] Do not drive yourself to the hospital. Summary  Hypertension is when the force of blood pumping through your arteries is too strong. If this condition is not controlled, it may put you at risk for serious complications.  Your personal target blood pressure may vary depending on your medical conditions, your age, and other factors. For most people, a normal blood pressure is less than 120/80.  Hypertension is managed by lifestyle changes, medicines, or both.  Lifestyle changes to help manage hypertension include losing weight, eating a healthy, low-sodium diet, exercising more, stopping smoking, and limiting alcohol. This information is not intended to replace advice given to you by your health care provider. Make sure you discuss any questions you have with your health care  provider. Document Revised: 02/24/2019 Document Reviewed: 12/20/2018 Elsevier Patient Education  2021 [place].   Diabetes Mellitus and Nutrition, Adult When you have diabetes, or diabetes mellitus, it is very important to have healthy eating habits because your blood sugar (glucose) levels are greatly affected by what you eat and drink. Eating healthy foods in the right amounts, at about the same times every day, can help you:  Control your blood glucose.  Lower your risk of heart disease.  Improve your blood pressure.  Reach or maintain a healthy weight. What can affect my meal plan? Every person with diabetes is different, and each person has different needs for a meal plan. Your health care provider may recommend that you work with a dietitian to make a meal plan that is best for you. Your meal plan may vary depending on factors such as:  The calories you need.  The medicines you take.  Your weight.  Your blood glucose, blood pressure, and cholesterol levels.  Your activity level.  Other health conditions you have, such as heart or kidney disease. How do carbohydrates affect me? Carbohydrates, also called carbs, affect your blood glucose level more than any other type of food. Eating carbs naturally raises the amount of glucose in your blood. Carb counting is a method for keeping track of how many carbs you eat. Counting carbs is important to keep your blood glucose at a healthy level, especially if  you use insulin or take certain oral diabetes medicines. It is important to know how many carbs you can safely have in each meal. This is different for every person. Your dietitian can help you calculate how many carbs you should have at each meal and for each snack. How does alcohol affect me? Alcohol can cause a sudden decrease in blood glucose (hypoglycemia), especially if you use insulin or take certain oral diabetes medicines. Hypoglycemia can be a life-threatening  condition. Symptoms of hypoglycemia, such as sleepiness, dizziness, and confusion, are similar to symptoms of having too much alcohol.  Do not drink alcohol if: ? Your health care provider tells you not to drink. ? You are pregnant, may be pregnant, or are planning to become pregnant.  If you drink alcohol: ? Do not drink on an empty stomach. ? Limit how much you use to:  0-1 drink a day for women.  0-2 drinks a day for men. ? Be aware of how much alcohol is in your drink. In the [place] one drink equals one 12 oz bottle of beer (355 mL), one 5 oz glass of wine (148 mL), or one 1 oz glass of hard liquor (44 mL). ? Keep yourself hydrated with water, diet soda, or unsweetened iced tea.  Keep in mind that regular soda, juice, and other mixers may contain a lot of sugar and must be counted as carbs. What are tips for following this plan? Reading food labels  Start by checking the serving size on the "Nutrition Facts" label of packaged foods and drinks. The amount of calories, carbs, fats, and other nutrients listed on the label is based on one serving of the item. Many items contain more than one serving per package.  Check the total grams (g) of carbs in one serving. You can calculate the number of servings of carbs in one serving by dividing the total carbs by 15. For example, if a food has 30 g of total carbs per serving, it would be equal to 2 servings of carbs.  Check the number of grams (g) of saturated fats and trans fats in one serving. Choose foods that have a low amount or none of these fats.  Check the number of milligrams (mg) of salt (sodium) in one serving. Most people should limit total sodium intake to less than 2,300 mg per day.  Always check the nutrition information of foods labeled as "low-fat" or "nonfat." These foods may be higher in added sugar or refined carbs and should be avoided.  Talk to your dietitian to identify your daily goals for nutrients listed on the  label. Shopping  Avoid buying canned, pre-made, or processed foods. These foods tend to be high in fat, sodium, and added sugar.  Shop around the outside edge of the grocery store. This is where you will most often find fresh fruits and vegetables, bulk grains, fresh meats, and fresh dairy. Cooking  Use low-heat cooking methods, such as baking, instead of high-heat cooking methods like deep frying.  Cook using healthy oils, such as olive, canola, or sunflower oil.  Avoid cooking with butter, cream, or high-fat meats. Meal planning  Eat meals and snacks regularly, preferably at the same times every day. Avoid going long periods of time without eating.  Eat foods that are high in fiber, such as fresh fruits, vegetables, beans, and whole grains. Talk with your dietitian about how many servings of carbs you can eat at each meal.  Eat 4-6 oz (112-168 g)  of lean protein each day, such as lean meat, chicken, fish, eggs, or tofu. One ounce (oz) of lean protein is equal to: ? 1 oz (28 g) of meat, chicken, or fish. ? 1 egg. ?  cup (62 g) of tofu.  Eat some foods each day that contain healthy fats, such as avocado, nuts, seeds, and fish.   What foods should I eat? Fruits Berries. Apples. Oranges. Peaches. Apricots. Plums. Grapes. Mango. Papaya. Pomegranate. Kiwi. Cherries. Vegetables Lettuce. Spinach. Leafy greens, including kale, chard, collard greens, and mustard greens. Beets. Cauliflower. Cabbage. Broccoli. Carrots. Green beans. Tomatoes. Peppers. Onions. Cucumbers. Brussels sprouts. Grains Whole grains, such as whole-wheat or whole-grain bread, crackers, tortillas, cereal, and pasta. Unsweetened oatmeal. Quinoa. Brown or wild rice. Meats and other proteins Seafood. Poultry without skin. Lean cuts of poultry and beef. Tofu. Nuts. Seeds. Dairy Low-fat or fat-free dairy products such as milk, yogurt, and cheese. The items listed above may not be a complete list of foods and beverages you  can eat. Contact a dietitian for more information. What foods should I avoid? Fruits Fruits canned with syrup. Vegetables Canned vegetables. Frozen vegetables with butter or cream sauce. Grains Refined white flour and flour products such as bread, pasta, snack foods, and cereals. Avoid all processed foods. Meats and other proteins Fatty cuts of meat. Poultry with skin. Breaded or fried meats. Processed meat. Avoid saturated fats. Dairy Full-fat yogurt, cheese, or milk. Beverages Sweetened drinks, such as soda or iced tea. The items listed above may not be a complete list of foods and beverages you should avoid. Contact a dietitian for more information. Questions to ask a health care provider  Do I need to meet with a diabetes educator?  Do I need to meet with a dietitian?  What number can I call if I have questions?  When are the best times to check my blood glucose? Where to find more information:  American Diabetes Association: diabetes.org  Academy of Nutrition and Dietetics: www.eatright.[employer] of Diabetes and Digestive and Kidney Diseases: [place].dk  Association of Diabetes Care and Education Specialists: www.diabeteseducator.org Summary  It is important to have healthy eating habits because your blood sugar (glucose) levels are greatly affected by what you eat and drink.  A healthy meal plan will help you control your blood glucose and maintain a healthy lifestyle.  Your health care provider may recommend that you work with a dietitian to make a meal plan that is best for you.  Keep in mind that carbohydrates (carbs) and alcohol have immediate effects on your blood glucose levels. It is important to count carbs and to use alcohol carefully. This information is not intended to replace advice given to you by your health care provider. Make sure you discuss any questions you have with your health care provider. Document Revised: 12/27/2018  Document Reviewed: 12/27/2018 Elsevier Patient Education  2021 [place].   Type 2 Diabetes Mellitus, Self-Care, Adult Caring for yourself after you have been diagnosed with type 2 diabetes (type 2 diabetes mellitus) means keeping your blood sugar (glucose) under control with a balance of:  Nutrition.  Exercise.  Lifestyle changes.  Medicines or insulin, if needed.  Support from your team of health care providers and others. The following information explains what you need to know to manage your diabetes at home. What are the risks? Having diabetes can put you at risk for other long-term (chronic) conditions, such as heart disease and kidney disease. Your health care provider may  prescribe medicines to help prevent complications from diabetes. How to monitor blood glucose  Check your blood glucose every day, as often as told by your health care provider.  Have your A1C (hemoglobin A1C) level checked two or more times a year, or as often as told by your health care provider.  Your health care provider will set personalized treatment goals for you. Generally, the goal of treatment is to maintain the following blood glucose levels: ? Before meals: 80-130 mg/dL (4.4-7.2 mmol/L). ? After meals: below 180 mg/dL (10 mmol/L). ? A1C level: less than 7%.   How to manage hyperglycemia and hypoglycemia Hyperglycemia symptoms Hyperglycemia, also called high blood glucose, occurs when blood glucose is too high. Make sure you know the early signs of hyperglycemia, such as:  Increased thirst.  Hunger.  Feeling very tired.  Needing to urinate more often than usual.  Blurry vision. Hypoglycemia symptoms Hypoglycemia, also called low blood glucose, occurs with a blood glucose level at or below 70 mg/dL (3.9 mmol/L). Diabetes medicines lower your blood glucose and can cause hypoglycemia. The risk for hypoglycemia increases during or after exercise, during sleep, during illness, and when  skipping meals or not eating for a long time (fasting). It is important to know the symptoms of hypoglycemia and treat it right away. Always have a 15-gram rapid-acting carbohydrate snack with you to treat low blood glucose. Family members and close friends should also know the symptoms and understand how to treat hypoglycemia, in case you are not able to treat yourself. Symptoms may include:  Hunger.  Anxiety.  Sweating and feeling clammy.  Dizziness or feeling light-headed.  Sleepiness.  Increased heart rate.  Irritability.  Tingling or numbness around the mouth, lips, or tongue.  Restless sleep. Severe hypoglycemia is when your blood glucose level is at or below 54 mg/dL (3 mmol/L). Severe hypoglycemia is an emergency. Do not wait to see if the symptoms will go away. Get medical help right away. Call your local emergency services (911 in the [place] Do not drive yourself to the hospital. If you have severe hypoglycemia and you cannot eat or drink, you may need glucagon. A family member or close friend should learn how to check your blood glucose and how to give you glucagon. Ask your health care provider if you need to have an emergency glucagon kit available. Follow these instructions at home: Medicines  Take diabetes medicines as told by your health care provider. If your health care provider prescribed insulin or diabetes medicines, take them every day.  Do not run out of insulin or other diabetes medicines. Plan ahead so you always have these available.  If you use insulin, adjust your dosage based on your physical activity and what foods you eat. Your health care provider will tell you how to adjust your dosage.  Take over-the-counter and prescription medicines only as told by your health care provider. Eating and drinking What you eat and drink affects your blood glucose and your insulin dosage. Making good choices helps to control your diabetes and prevent other health  problems. A healthy meal plan includes eating lean proteins, complex carbohydrates, fresh fruits and vegetables, low-fat dairy products, and healthy fats. Make an appointment to see a registered dietitian to help you create an eating plan that is right for you. Make sure that you:  Follow instructions from your health care provider about eating or drinking restrictions.  Drink enough fluid to keep your urine pale yellow.  Keep a record of  the carbohydrates that you eat. Do this by reading food labels and learning the standard serving sizes of foods.  Follow your sick-day plan whenever you cannot eat or drink as usual. Make this plan in advance with your health care provider.   Activity  Stay active. Exercise regularly, as told by your health care provider. This may include: ? Stretching and doing strength exercises, such as yoga or weight lifting, 2 or more times a week. ? Doing 150 minutes or more of moderate-intensity or vigorous-intensity exercise each week. This could be brisk walking, biking, or water aerobics.  Spread out your activity over 3 or more days of the week.  Do not go more than 2 days in a row without doing some kind of physical activity.  When you start a new exercise or activity, work with your health care provider to adjust your insulin, medicines, or food intake as needed. Lifestyle  Do not use any products that contain nicotine or tobacco, such as cigarettes, e-cigarettes, and chewing tobacco. If you need help quitting, ask your health care provider.  If your health care provider says that alcohol is safe for you, limit how much you use to no more than 1 drink a day for women who are not pregnant and 2 drinks a day for men. In the [place] one drink equals one 12 oz bottle of beer (355 mL), one 5 oz glass of wine (148 mL), or one 1 oz glass of hard liquor (44 mL).  Learn to manage stress. If you need help with this, ask your health care provider. Take care of your  body  Keep your immunizations up to date. In addition to getting vaccinations as told by your health care provider, it is recommended that you get vaccinated against the following illnesses: ? The flu (influenza). Get a flu shot every year. ? Pneumonia. ? Hepatitis B.  Schedule an eye exam soon after your diagnosis, and then one time every year after that.  Check your skin and feet every day for cuts, bruises, redness, blisters, or sores. Schedule a foot exam with your health care provider once every year.  Brush your teeth and gums two times a day, and floss one or more times a day. Visit your dentist one or more times every 6 months.  Maintain a healthy weight.   General instructions  Share your diabetes management plan with people in your workplace, school, and household.  Carry a medical alert card or wear medical alert jewelry.  Keep all follow-up visits as told by your health care provider. This is important. Questions to ask your health care provider  Should I meet with a certified diabetes care and education specialist?  Where can I find a support group for people with diabetes? Where to find more information  American Diabetes Association (ADA): www.diabetes.org  American Association of Diabetes Care and Education Specialists (ADCES): www.diabeteseducator.org  International Diabetes Federation (IDF): MemberVerification.ca Summary  Caring for yourself after you have been diagnosed with type 2 diabetes (type 2 diabetes mellitus) means keeping your blood sugar (glucose) under control with a balance of nutrition, exercise, lifestyle changes, and medicine.  Check your blood glucose every day, as often as told by your health care provider.  Having diabetes can put you at risk for other long-term (chronic) conditions, such as heart disease and kidney disease. Your health care provider may prescribe medicines to help prevent complications from diabetes.  Share your diabetes management  plan with people in your workplace,  school, and household.  Keep all follow-up visits as told by your health care provider. This is important. This information is not intended to replace advice given to you by your health care provider. Make sure you discuss any questions you have with your health care provider. Document Revised: 02/27/2019 Document Reviewed: 02/28/2019 Elsevier Patient Education  2021 [place].   Hypoglycemia Hypoglycemia is when the sugar (glucose) level in your blood is too low. Low blood sugar can happen to people who have diabetes and people who do not have diabetes. Low blood sugar can happen quickly, and it can be an emergency. What are the causes? This condition happens most often in people who have diabetes and may be caused by:  Diabetes medicine.  Not eating enough, or not eating often enough.  Doing more physical activity.  Drinking alcohol on an empty stomach. If you do not have diabetes, hypoglycemia may be caused by:  A tumor in the pancreas.  Not eating enough, or not eating for long periods at a time (fasting).  A very bad infection or illness.  Problems after having weight loss (bariatric) surgery.  Kidney failure or liver failure.  Certain medicines. What increases the risk? This condition is more likely to develop in people who:  Have diabetes and take medicines to lower their blood sugar.  Abuse alcohol.  Have a very bad illness. What are the signs or symptoms? Symptoms depend on whether your low blood sugar is mild, moderate, or very low. Mild  Hunger.  Feeling worried or nervous (anxious).  Sweating and feeling clammy.  Feeling dizzy or light-headed.  Being sleepy or having trouble sleeping.  Feeling like you may vomit (nauseous).  A fast heartbeat.  A headache.  Blurry vision.  Being irritable or grouchy.  Tingling or loss of feeling (numbness) around your mouth, lips, or tongue.  Trouble with moving  (coordination). Moderate  Confusion and poor judgment.  Behavior changes.  Weakness.  Uneven heartbeats. Very low Very low blood sugar (severe hypoglycemia) is a medical emergency. It can cause:  Fainting.  Jerky movements that you cannot control (seizure).  Loss of consciousness (coma).  Death. How is this treated? Treating low blood sugar Low blood sugar is often treated by eating or drinking something sugary right away. The snack should contain 15 grams of a fast-acting carb (carbohydrate). Options include:  4 oz (120 mL) of fruit juice.  4-6 oz (120-150 mL) of regular soda (not diet soda).  8 oz (240 mL) of low-fat milk.  Several pieces of hard candy. Check food labels to find out how many to eat for 15 grams.  1 Tbsp (15 mL) of sugar or honey. Treating low blood sugar if you have diabetes If you can think clearly and swallow safely, follow the 15:15 rule:  Take 15 grams of a fast-acting carb. Talk with your doctor about how much you should take.  Always keep a source of fast-acting carb with you, such as: ? Sugar tablets (glucose pills). Take 4 pills. ? Several pieces of hard candy. Check food labels to see how many pieces to eat for 15 grams. ? 4 oz (120 mL) of fruit juice. ? 4-6 oz (120-150 mL) of regular (not diet) soda. ? 1 Tbsp (15 mL) of honey or sugar.  Check your blood sugar 15 minutes after you take the carb.  If your blood sugar is still at or below 70 mg/dL (3.9 mmol/L), take 15 grams of a carb again.  If your  blood sugar does not go above 70 mg/dL (3.9 mmol/L) after 3 tries, get help right away.  After your blood sugar goes back to normal, eat a meal or a snack within 1 hour.   Treating very low blood sugar If your blood sugar is at or below 54 mg/dL (3 mmol/L), you have very low blood sugar, or severe hypoglycemia. This is an emergency. Get medical help right away. If you have very low blood sugar and you cannot eat or drink, you will need to be  given a hormone called glucagon. A family member or friend should learn how to check your blood sugar and how to give you glucagon. Ask your doctor if you need to have an emergency glucagon kit at home. Very low blood sugar may also need to be treated in a hospital. Follow these instructions at home: General instructions  Take over-the-counter and prescription medicines only as told by your doctor.  Stay aware of your blood sugar as told by your doctor.  If you drink alcohol: ? Limit how much you use to:  0-1 drink a day for nonpregnant women.  0-2 drinks a day for men. ? Be aware of how much alcohol is in your drink. In the [place] one drink equals one 12 oz bottle of beer (355 mL), one 5 oz glass of wine (148 mL), or one 1 oz glass of hard liquor (44 mL).  Keep all follow-up visits as told by your doctor. This is important. If you have diabetes:  Always have a rapid-acting carb (15 grams) option with you to treat low blood sugar.  Follow your diabetes care plan as told by your doctor. Make sure you: ? Know the symptoms of low blood sugar. ? Check your blood sugar as often as told by your doctor. Always check it before and after exercise. ? Always check your blood sugar before you drive. ? Take your medicines as told. ? Follow your meal plan. ? Eat on time. Do not skip meals.  Share your diabetes care plan with: ? Your work or school. ? People you live with.  Carry a card or wear jewelry that says you have diabetes.   Contact a doctor if:  You have trouble keeping your blood sugar in your target range.  You have low blood sugar often. Get help right away if:  You still have symptoms after you eat or drink something that contains 15 grams of fast-acting carb and you cannot get your blood sugar above 70 mg/dL by following the 15:15 rule.  Your blood sugar is at or below 54 mg/dL (3 mmol/L).  You have a seizure.  You faint. These symptoms may be an emergency. Do not wait  to see if the symptoms will go away. Get medical help right away. Call your local emergency services (911 in the [place] Do not drive yourself to the hospital. Summary  Hypoglycemia happens when the level of sugar (glucose) in your blood is too low.  Low blood sugar can happen to people who have diabetes and people who do not have diabetes. Low blood sugar can happen quickly, and it can be an emergency.  Make sure you know the symptoms of low blood sugar and know how to treat it.  Always keep a source of sugar (fast-acting carb) with you to treat low blood sugar. This information is not intended to replace advice given to you by your health care provider. Make sure you discuss any questions you have with  your health care provider. Document Revised: 12/14/2018 Document Reviewed: 12/14/2018 Elsevier Patient Education  2021 [employer].     If you have lab work done today you will be contacted with your lab results within the next 2 weeks.  If you have not heard from [place] then please contact us. The fastest way to get your results is to register for My Chart.   IF you received an x-ray today, you will receive an invoice from [facility]. Please contact [facility] at [phone] with questions or concerns regarding your invoice.   IF you received labwork today, you will receive an invoice from [facility]. Please contact [facility] at [phone] with questions or concerns regarding your invoice.   Our billing staff will not be able to assist you with questions regarding bills from these companies.  You will be contacted with the lab results as soon as they are available. The fastest way to get your results is to activate your My Chart account. Instructions are located on the last page of this paperwork. If you have not heard from [place] regarding the results in 2 weeks, please contact this office.         Signed, Merri Ray, MD [facility]

## 2020-04-26 NOTE — Patient Instructions (Addendum)
I am concerned about your diabetes control and diet with work.  Breakfast each day, healthy snacks as possible, but I will refer you to nutritionist as well.  Check blood sugar if you feel shaky/sweting. See precautions below.  Depending on A1c we can discuss changes, but not until less risk of low blood sugars.   We recommend that you schedule a mammogram for breast cancer screening. Typically, you do not need a referral to do this. Please contact a local imaging center to schedule your mammogram.  [facility] - [phone]  *ask for the Rad[facility] [place] ([place]) - [phone] or [phone]  [facility] - [phone] [place] [phone] [facility] - [phone]  *ask for the [facility] - [phone]  *ask for the Radiology Department [facility] - [phone]  *ask for the [facility] - [phone]   Managing Your Hypertension Hypertension, also called high blood pressure, is when the force of the blood pressing against the walls of the arteries is too strong. Arteries are blood vessels that carry blood from your heart throughout your body. Hypertension forces the heart to work harder to pump blood and may cause the arteries to become narrow or stiff. Understanding blood pressure readings Your personal target blood pressure may vary depending on your medical conditions, your age, and other factors. A blood pressure reading includes a higher number over a lower number. Ideally, your blood pressure should be below 120/80. You should know that:  The first, or top, number is called the systolic pressure. It is a measure of the pressure in your arteries as your heart beats.  The second, or bottom number, is called the diastolic pressure. It is a measure of the pressure in your arteries as the heart relaxes. Blood  pressure is classified into four stages. Based on your blood pressure reading, your health care provider may use the following stages to determine what type of treatment you need, if any. Systolic pressure and diastolic pressure are measured in a unit called mmHg. Normal  Systolic pressure: below 675.  Diastolic pressure: below 80. Elevated  Systolic pressure: 449-201.  Diastolic pressure: below 80. Hypertension stage 1  Systolic pressure: 007-121.  Diastolic pressure: 97-58. Hypertension stage 2  Systolic pressure: 832 or above.  Diastolic pressure: 90 or above. How can this condition affect me? Managing your hypertension is an important responsibility. Over time, hypertension can damage the arteries and decrease blood flow to important parts of the body, including the brain, heart, and kidneys. Having untreated or uncontrolled hypertension can lead to:  A heart attack.  A stroke.  A weakened blood vessel (aneurysm).  Heart failure.  Kidney damage.  Eye damage.  Metabolic syndrome.  Memory and concentration problems.  Vascular dementia. What actions can I take to manage this condition? Hypertension can be managed by making lifestyle changes and possibly by taking medicines. Your health care provider will help you make a plan to bring your blood pressure within a normal range. Nutrition  Eat a diet that is high in fiber and potassium, and low in salt (sodium), added sugar, and fat. An example eating plan is called the Dietary Approaches to Stop Hypertension (DASH) diet. To eat this way: ? Eat plenty of fresh fruits and vegetables. Try to fill one-half of your plate at each meal with fruits and vegetables. ? Eat whole grains, such as whole-wheat  pasta, brown rice, or whole-grain bread. Fill about one-fourth of your plate with whole grains. ? Eat low-fat dairy products. ? Avoid fatty cuts of meat, processed or cured meats, and poultry with skin. Fill about one-fourth of  your plate with lean proteins such as fish, chicken without skin, beans, eggs, and tofu. ? Avoid pre-made and processed foods. These tend to be higher in sodium, added sugar, and fat.  Reduce your daily sodium intake. Most people with hypertension should eat less than 1,500 mg of sodium a day.   Lifestyle  Work with your health care provider to maintain a healthy body weight or to lose weight. Ask what an ideal weight is for you.  Get at least 30 minutes of exercise that causes your heart to beat faster (aerobic exercise) most days of the week. Activities may include walking, swimming, or biking.  Include exercise to strengthen your muscles (resistance exercise), such as weight lifting, as part of your weekly exercise routine. Try to do these types of exercises for 30 minutes at least 3 days a week.  Do not use any products that contain nicotine or tobacco, such as cigarettes, e-cigarettes, and chewing tobacco. If you need help quitting, ask your health care provider.  Control any long-term (chronic) conditions you have, such as high cholesterol or diabetes.  Identify your sources of stress and find ways to manage stress. This may include meditation, deep breathing, or making time for fun activities.   Alcohol use  Do not drink alcohol if: ? Your health care provider tells you not to drink. ? You are pregnant, may be pregnant, or are planning to become pregnant.  If you drink alcohol: ? Limit how much you use to:  0-1 drink a day for women.  0-2 drinks a day for men. ? Be aware of how much alcohol is in your drink. In the [place] one drink equals one 12 oz bottle of beer (355 mL), one 5 oz glass of wine (148 mL), or one 1 oz glass of hard liquor (44 mL). Medicines Your health care provider may prescribe medicine if lifestyle changes are not enough to get your blood pressure under control and if:  Your systolic blood pressure is 130 or higher.  Your diastolic blood pressure is 80 or  higher. Take medicines only as told by your health care provider. Follow the directions carefully. Blood pressure medicines must be taken as told by your health care provider. The medicine does not work as well when you skip doses. Skipping doses also puts you at risk for problems. Monitoring Before you monitor your blood pressure:  Do not smoke, drink caffeinated beverages, or exercise within 30 minutes before taking a measurement.  Use the bathroom and empty your bladder (urinate).  Sit quietly for at least 5 minutes before taking measurements. Monitor your blood pressure at home as told by your health care provider. To do this:  Sit with your back straight and supported.  Place your feet flat on the floor. Do not cross your legs.  Support your arm on a flat surface, such as a table. Make sure your upper arm is at heart level.  Each time you measure, take two or three readings one minute apart and record the results. You may also need to have your blood pressure checked regularly by your health care provider.   General information  Talk with your health care provider about your diet, exercise habits, and other lifestyle factors that may be contributing to  hypertension.  Review all the medicines you take with your health care provider because there may be side effects or interactions.  Keep all visits as told by your health care provider. Your health care provider can help you create and adjust your plan for managing your high blood pressure. Where to find more information  National Heart, Lung, and Blood Institute: https://wilson-eaton.com/  American Heart Association: www.heart.org Contact a health care provider if:  You think you are having a reaction to medicines you have taken.  You have repeated (recurrent) headaches.  You feel dizzy.  You have swelling in your ankles.  You have trouble with your vision. Get help right away if:  You develop a severe headache or  confusion.  You have unusual weakness or numbness, or you feel faint.  You have severe pain in your chest or abdomen.  You vomit repeatedly.  You have trouble breathing. These symptoms may represent a serious problem that is an emergency. Do not wait to see if the symptoms will go away. Get medical help right away. Call your local emergency services (911 in the [place] Do not drive yourself to the hospital. Summary  Hypertension is when the force of blood pumping through your arteries is too strong. If this condition is not controlled, it may put you at risk for serious complications.  Your personal target blood pressure may vary depending on your medical conditions, your age, and other factors. For most people, a normal blood pressure is less than 120/80.  Hypertension is managed by lifestyle changes, medicines, or both.  Lifestyle changes to help manage hypertension include losing weight, eating a healthy, low-sodium diet, exercising more, stopping smoking, and limiting alcohol. This information is not intended to replace advice given to you by your health care provider. Make sure you discuss any questions you have with your health care provider. Document Revised: 02/24/2019 Document Reviewed: 12/20/2018 Elsevier Patient Education  2021 [place].   Diabetes Mellitus and Nutrition, Adult When you have diabetes, or diabetes mellitus, it is very important to have healthy eating habits because your blood sugar (glucose) levels are greatly affected by what you eat and drink. Eating healthy foods in the right amounts, at about the same times every day, can help you:  Control your blood glucose.  Lower your risk of heart disease.  Improve your blood pressure.  Reach or maintain a healthy weight. What can affect my meal plan? Every person with diabetes is different, and each person has different needs for a meal plan. Your health care provider may recommend that you work with a  dietitian to make a meal plan that is best for you. Your meal plan may vary depending on factors such as:  The calories you need.  The medicines you take.  Your weight.  Your blood glucose, blood pressure, and cholesterol levels.  Your activity level.  Other health conditions you have, such as heart or kidney disease. How do carbohydrates affect me? Carbohydrates, also called carbs, affect your blood glucose level more than any other type of food. Eating carbs naturally raises the amount of glucose in your blood. Carb counting is a method for keeping track of how many carbs you eat. Counting carbs is important to keep your blood glucose at a healthy level, especially if you use insulin or take certain oral diabetes medicines. It is important to know how many carbs you can safely have in each meal. This is different for every person. Your dietitian can help you  calculate how many carbs you should have at each meal and for each snack. How does alcohol affect me? Alcohol can cause a sudden decrease in blood glucose (hypoglycemia), especially if you use insulin or take certain oral diabetes medicines. Hypoglycemia can be a life-threatening condition. Symptoms of hypoglycemia, such as sleepiness, dizziness, and confusion, are similar to symptoms of having too much alcohol.  Do not drink alcohol if: ? Your health care provider tells you not to drink. ? You are pregnant, may be pregnant, or are planning to become pregnant.  If you drink alcohol: ? Do not drink on an empty stomach. ? Limit how much you use to:  0-1 drink a day for women.  0-2 drinks a day for men. ? Be aware of how much alcohol is in your drink. In the [place] one drink equals one 12 oz bottle of beer (355 mL), one 5 oz glass of wine (148 mL), or one 1 oz glass of hard liquor (44 mL). ? Keep yourself hydrated with water, diet soda, or unsweetened iced tea.  Keep in mind that regular soda, juice, and other mixers may contain a  lot of sugar and must be counted as carbs. What are tips for following this plan? Reading food labels  Start by checking the serving size on the "Nutrition Facts" label of packaged foods and drinks. The amount of calories, carbs, fats, and other nutrients listed on the label is based on one serving of the item. Many items contain more than one serving per package.  Check the total grams (g) of carbs in one serving. You can calculate the number of servings of carbs in one serving by dividing the total carbs by 15. For example, if a food has 30 g of total carbs per serving, it would be equal to 2 servings of carbs.  Check the number of grams (g) of saturated fats and trans fats in one serving. Choose foods that have a low amount or none of these fats.  Check the number of milligrams (mg) of salt (sodium) in one serving. Most people should limit total sodium intake to less than 2,300 mg per day.  Always check the nutrition information of foods labeled as "low-fat" or "nonfat." These foods may be higher in added sugar or refined carbs and should be avoided.  Talk to your dietitian to identify your daily goals for nutrients listed on the label. Shopping  Avoid buying canned, pre-made, or processed foods. These foods tend to be high in fat, sodium, and added sugar.  Shop around the outside edge of the grocery store. This is where you will most often find fresh fruits and vegetables, bulk grains, fresh meats, and fresh dairy. Cooking  Use low-heat cooking methods, such as baking, instead of high-heat cooking methods like deep frying.  Cook using healthy oils, such as olive, canola, or sunflower oil.  Avoid cooking with butter, cream, or high-fat meats. Meal planning  Eat meals and snacks regularly, preferably at the same times every day. Avoid going long periods of time without eating.  Eat foods that are high in fiber, such as fresh fruits, vegetables, beans, and whole grains. Talk with your  dietitian about how many servings of carbs you can eat at each meal.  Eat 4-6 oz (112-168 g) of lean protein each day, such as lean meat, chicken, fish, eggs, or tofu. One ounce (oz) of lean protein is equal to: ? 1 oz (28 g) of meat, chicken, or fish. ? 1  egg. ?  cup (62 g) of tofu.  Eat some foods each day that contain healthy fats, such as avocado, nuts, seeds, and fish.   What foods should I eat? Fruits Berries. Apples. Oranges. Peaches. Apricots. Plums. Grapes. Mango. Papaya. Pomegranate. Kiwi. Cherries. Vegetables Lettuce. Spinach. Leafy greens, including kale, chard, collard greens, and mustard greens. Beets. Cauliflower. Cabbage. Broccoli. Carrots. Green beans. Tomatoes. Peppers. Onions. Cucumbers. Brussels sprouts. Grains Whole grains, such as whole-wheat or whole-grain bread, crackers, tortillas, cereal, and pasta. Unsweetened oatmeal. Quinoa. Brown or wild rice. Meats and other proteins Seafood. Poultry without skin. Lean cuts of poultry and beef. Tofu. Nuts. Seeds. Dairy Low-fat or fat-free dairy products such as milk, yogurt, and cheese. The items listed above may not be a complete list of foods and beverages you can eat. Contact a dietitian for more information. What foods should I avoid? Fruits Fruits canned with syrup. Vegetables Canned vegetables. Frozen vegetables with butter or cream sauce. Grains Refined white flour and flour products such as bread, pasta, snack foods, and cereals. Avoid all processed foods. Meats and other proteins Fatty cuts of meat. Poultry with skin. Breaded or fried meats. Processed meat. Avoid saturated fats. Dairy Full-fat yogurt, cheese, or milk. Beverages Sweetened drinks, such as soda or iced tea. The items listed above may not be a complete list of foods and beverages you should avoid. Contact a dietitian for more information. Questions to ask a health care provider  Do I need to meet with a diabetes educator?  Do I need to meet  with a dietitian?  What number can I call if I have questions?  When are the best times to check my blood glucose? Where to find more information:  American Diabetes Association: diabetes.org  Academy of Nutrition and Dietetics: www.eatright.[employer] of Diabetes and Digestive and Kidney Diseases: [employer]Funeral.dk  Association of Diabetes Care and Education Specialists: www.diabeteseducator.org Summary  It is important to have healthy eating habits because your blood sugar (glucose) levels are greatly affected by what you eat and drink.  A healthy meal plan will help you control your blood glucose and maintain a healthy lifestyle.  Your health care provider may recommend that you work with a dietitian to make a meal plan that is best for you.  Keep in mind that carbohydrates (carbs) and alcohol have immediate effects on your blood glucose levels. It is important to count carbs and to use alcohol carefully. This information is not intended to replace advice given to you by your health care provider. Make sure you discuss any questions you have with your health care provider. Document Revised: 12/27/2018 Document Reviewed: 12/27/2018 Elsevier Patient Education  2021 [place].   Type 2 Diabetes Mellitus, Self-Care, Adult Caring for yourself after you have been diagnosed with type 2 diabetes (type 2 diabetes mellitus) means keeping your blood sugar (glucose) under control with a balance of:  Nutrition.  Exercise.  Lifestyle changes.  Medicines or insulin, if needed.  Support from your team of health care providers and others. The following information explains what you need to know to manage your diabetes at home. What are the risks? Having diabetes can put you at risk for other long-term (chronic) conditions, such as heart disease and kidney disease. Your health care provider may prescribe medicines to help prevent complications from diabetes. How to  monitor blood glucose  Check your blood glucose every day, as often as told by your health care provider.  Have your A1C (hemoglobin A1C)  level checked two or more times a year, or as often as told by your health care provider.  Your health care provider will set personalized treatment goals for you. Generally, the goal of treatment is to maintain the following blood glucose levels: ? Before meals: 80-130 mg/dL (4.4-7.2 mmol/L). ? After meals: below 180 mg/dL (10 mmol/L). ? A1C level: less than 7%.   How to manage hyperglycemia and hypoglycemia Hyperglycemia symptoms Hyperglycemia, also called high blood glucose, occurs when blood glucose is too high. Make sure you know the early signs of hyperglycemia, such as:  Increased thirst.  Hunger.  Feeling very tired.  Needing to urinate more often than usual.  Blurry vision. Hypoglycemia symptoms Hypoglycemia, also called low blood glucose, occurs with a blood glucose level at or below 70 mg/dL (3.9 mmol/L). Diabetes medicines lower your blood glucose and can cause hypoglycemia. The risk for hypoglycemia increases during or after exercise, during sleep, during illness, and when skipping meals or not eating for a long time (fasting). It is important to know the symptoms of hypoglycemia and treat it right away. Always have a 15-gram rapid-acting carbohydrate snack with you to treat low blood glucose. Family members and close friends should also know the symptoms and understand how to treat hypoglycemia, in case you are not able to treat yourself. Symptoms may include:  Hunger.  Anxiety.  Sweating and feeling clammy.  Dizziness or feeling light-headed.  Sleepiness.  Increased heart rate.  Irritability.  Tingling or numbness around the mouth, lips, or tongue.  Restless sleep. Severe hypoglycemia is when your blood glucose level is at or below 54 mg/dL (3 mmol/L). Severe hypoglycemia is an emergency. Do not wait to see if the symptoms  will go away. Get medical help right away. Call your local emergency services (911 in the [place] Do not drive yourself to the hospital. If you have severe hypoglycemia and you cannot eat or drink, you may need glucagon. A family member or close friend should learn how to check your blood glucose and how to give you glucagon. Ask your health care provider if you need to have an emergency glucagon kit available. Follow these instructions at home: Medicines  Take diabetes medicines as told by your health care provider. If your health care provider prescribed insulin or diabetes medicines, take them every day.  Do not run out of insulin or other diabetes medicines. Plan ahead so you always have these available.  If you use insulin, adjust your dosage based on your physical activity and what foods you eat. Your health care provider will tell you how to adjust your dosage.  Take over-the-counter and prescription medicines only as told by your health care provider. Eating and drinking What you eat and drink affects your blood glucose and your insulin dosage. Making good choices helps to control your diabetes and prevent other health problems. A healthy meal plan includes eating lean proteins, complex carbohydrates, fresh fruits and vegetables, low-fat dairy products, and healthy fats. Make an appointment to see a registered dietitian to help you create an eating plan that is right for you. Make sure that you:  Follow instructions from your health care provider about eating or drinking restrictions.  Drink enough fluid to keep your urine pale yellow.  Keep a record of the carbohydrates that you eat. Do this by reading food labels and learning the standard serving sizes of foods.  Follow your sick-day plan whenever you cannot eat or drink as usual. Make this plan in  advance with your health care provider.   Activity  Stay active. Exercise regularly, as told by your health care provider. This may  include: ? Stretching and doing strength exercises, such as yoga or weight lifting, 2 or more times a week. ? Doing 150 minutes or more of moderate-intensity or vigorous-intensity exercise each week. This could be brisk walking, biking, or water aerobics.  Spread out your activity over 3 or more days of the week.  Do not go more than 2 days in a row without doing some kind of physical activity.  When you start a new exercise or activity, work with your health care provider to adjust your insulin, medicines, or food intake as needed. Lifestyle  Do not use any products that contain nicotine or tobacco, such as cigarettes, e-cigarettes, and chewing tobacco. If you need help quitting, ask your health care provider.  If your health care provider says that alcohol is safe for you, limit how much you use to no more than 1 drink a day for women who are not pregnant and 2 drinks a day for men. In the [place] one drink equals one 12 oz bottle of beer (355 mL), one 5 oz glass of wine (148 mL), or one 1 oz glass of hard liquor (44 mL).  Learn to manage stress. If you need help with this, ask your health care provider. Take care of your body  Keep your immunizations up to date. In addition to getting vaccinations as told by your health care provider, it is recommended that you get vaccinated against the following illnesses: ? The flu (influenza). Get a flu shot every year. ? Pneumonia. ? Hepatitis B.  Schedule an eye exam soon after your diagnosis, and then one time every year after that.  Check your skin and feet every day for cuts, bruises, redness, blisters, or sores. Schedule a foot exam with your health care provider once every year.  Brush your teeth and gums two times a day, and floss one or more times a day. Visit your dentist one or more times every 6 months.  Maintain a healthy weight.   General instructions  Share your diabetes management plan with people in your workplace, school, and  household.  Carry a medical alert card or wear medical alert jewelry.  Keep all follow-up visits as told by your health care provider. This is important. Questions to ask your health care provider  Should I meet with a certified diabetes care and education specialist?  Where can I find a support group for people with diabetes? Where to find more information  American Diabetes Association (ADA): www.diabetes.org  American Association of Diabetes Care and Education Specialists (ADCES): www.diabeteseducator.org  International Diabetes Federation (IDF): MemberVerification.ca Summary  Caring for yourself after you have been diagnosed with type 2 diabetes (type 2 diabetes mellitus) means keeping your blood sugar (glucose) under control with a balance of nutrition, exercise, lifestyle changes, and medicine.  Check your blood glucose every day, as often as told by your health care provider.  Having diabetes can put you at risk for other long-term (chronic) conditions, such as heart disease and kidney disease. Your health care provider may prescribe medicines to help prevent complications from diabetes.  Share your diabetes management plan with people in your workplace, school, and household.  Keep all follow-up visits as told by your health care provider. This is important. This information is not intended to replace advice given to you by your health care provider. Make sure  you discuss any questions you have with your health care provider. Document Revised: 02/27/2019 Document Reviewed: 02/28/2019 Elsevier Patient Education  2021 [place].   Hypoglycemia Hypoglycemia is when the sugar (glucose) level in your blood is too low. Low blood sugar can happen to people who have diabetes and people who do not have diabetes. Low blood sugar can happen quickly, and it can be an emergency. What are the causes? This condition happens most often in people who have diabetes and may be caused  by:  Diabetes medicine.  Not eating enough, or not eating often enough.  Doing more physical activity.  Drinking alcohol on an empty stomach. If you do not have diabetes, hypoglycemia may be caused by:  A tumor in the pancreas.  Not eating enough, or not eating for long periods at a time (fasting).  A very bad infection or illness.  Problems after having weight loss (bariatric) surgery.  Kidney failure or liver failure.  Certain medicines. What increases the risk? This condition is more likely to develop in people who:  Have diabetes and take medicines to lower their blood sugar.  Abuse alcohol.  Have a very bad illness. What are the signs or symptoms? Symptoms depend on whether your low blood sugar is mild, moderate, or very low. Mild  Hunger.  Feeling worried or nervous (anxious).  Sweating and feeling clammy.  Feeling dizzy or light-headed.  Being sleepy or having trouble sleeping.  Feeling like you may vomit (nauseous).  A fast heartbeat.  A headache.  Blurry vision.  Being irritable or grouchy.  Tingling or loss of feeling (numbness) around your mouth, lips, or tongue.  Trouble with moving (coordination). Moderate  Confusion and poor judgment.  Behavior changes.  Weakness.  Uneven heartbeats. Very low Very low blood sugar (severe hypoglycemia) is a medical emergency. It can cause:  Fainting.  Jerky movements that you cannot control (seizure).  Loss of consciousness (coma).  Death. How is this treated? Treating low blood sugar Low blood sugar is often treated by eating or drinking something sugary right away. The snack should contain 15 grams of a fast-acting carb (carbohydrate). Options include:  4 oz (120 mL) of fruit juice.  4-6 oz (120-150 mL) of regular soda (not diet soda).  8 oz (240 mL) of low-fat milk.  Several pieces of hard candy. Check food labels to find out how many to eat for 15 grams.  1 Tbsp (15 mL) of sugar  or honey. Treating low blood sugar if you have diabetes If you can think clearly and swallow safely, follow the 15:15 rule:  Take 15 grams of a fast-acting carb. Talk with your doctor about how much you should take.  Always keep a source of fast-acting carb with you, such as: ? Sugar tablets (glucose pills). Take 4 pills. ? Several pieces of hard candy. Check food labels to see how many pieces to eat for 15 grams. ? 4 oz (120 mL) of fruit juice. ? 4-6 oz (120-150 mL) of regular (not diet) soda. ? 1 Tbsp (15 mL) of honey or sugar.  Check your blood sugar 15 minutes after you take the carb.  If your blood sugar is still at or below 70 mg/dL (3.9 mmol/L), take 15 grams of a carb again.  If your blood sugar does not go above 70 mg/dL (3.9 mmol/L) after 3 tries, get help right away.  After your blood sugar goes back to normal, eat a meal or a snack within 1 hour.  Treating very low blood sugar If your blood sugar is at or below 54 mg/dL (3 mmol/L), you have very low blood sugar, or severe hypoglycemia. This is an emergency. Get medical help right away. If you have very low blood sugar and you cannot eat or drink, you will need to be given a hormone called glucagon. A family member or friend should learn how to check your blood sugar and how to give you glucagon. Ask your doctor if you need to have an emergency glucagon kit at home. Very low blood sugar may also need to be treated in a hospital. Follow these instructions at home: General instructions  Take over-the-counter and prescription medicines only as told by your doctor.  Stay aware of your blood sugar as told by your doctor.  If you drink alcohol: ? Limit how much you use to:  0-1 drink a day for nonpregnant women.  0-2 drinks a day for men. ? Be aware of how much alcohol is in your drink. In the [place] one drink equals one 12 oz bottle of beer (355 mL), one 5 oz glass of wine (148 mL), or one 1 oz glass of hard liquor (44  mL).  Keep all follow-up visits as told by your doctor. This is important. If you have diabetes:  Always have a rapid-acting carb (15 grams) option with you to treat low blood sugar.  Follow your diabetes care plan as told by your doctor. Make sure you: ? Know the symptoms of low blood sugar. ? Check your blood sugar as often as told by your doctor. Always check it before and after exercise. ? Always check your blood sugar before you drive. ? Take your medicines as told. ? Follow your meal plan. ? Eat on time. Do not skip meals.  Share your diabetes care plan with: ? Your work or school. ? People you live with.  Carry a card or wear jewelry that says you have diabetes.   Contact a doctor if:  You have trouble keeping your blood sugar in your target range.  You have low blood sugar often. Get help right away if:  You still have symptoms after you eat or drink something that contains 15 grams of fast-acting carb and you cannot get your blood sugar above 70 mg/dL by following the 15:15 rule.  Your blood sugar is at or below 54 mg/dL (3 mmol/L).  You have a seizure.  You faint. These symptoms may be an emergency. Do not wait to see if the symptoms will go away. Get medical help right away. Call your local emergency services (911 in the [place] Do not drive yourself to the hospital. Summary  Hypoglycemia happens when the level of sugar (glucose) in your blood is too low.  Low blood sugar can happen to people who have diabetes and people who do not have diabetes. Low blood sugar can happen quickly, and it can be an emergency.  Make sure you know the symptoms of low blood sugar and know how to treat it.  Always keep a source of sugar (fast-acting carb) with you to treat low blood sugar. This information is not intended to replace advice given to you by your health care provider. Make sure you discuss any questions you have with your health care provider. Document Revised:  12/14/2018 Document Reviewed: 12/14/2018 Elsevier Patient Education  2021 [employer].     If you have lab work done today you will be contacted with your lab results within  the next 2 weeks.  If you have not heard from [place] then please contact us. The fastest way to get your results is to register for My Chart.   IF you received an x-ray today, you will receive an invoice from [facility]. Please contact [facility] at [phone] with questions or concerns regarding your invoice.   IF you received labwork today, you will receive an invoice from [facility]. Please contact [facility] at [phone] with questions or concerns regarding your invoice.   Our billing staff will not be able to assist you with questions regarding bills from these companies.  You will be contacted with the lab results as soon as they are available. The fastest way to get your results is to activate your My Chart account. Instructions are located on the last page of this paperwork. If you have not heard from [place] regarding the results in 2 weeks, please contact this office.

## 2020-05-16 ENCOUNTER — Encounter: Payer: Self-pay | Admitting: Family Medicine

## 2020-05-29 ENCOUNTER — Other Ambulatory Visit: Payer: Self-pay

## 2020-05-29 ENCOUNTER — Telehealth: Payer: Self-pay

## 2020-05-29 NOTE — Telephone Encounter (Signed)
[place] to call patient and see [place] [facility] [place]  So I can fax the labs over.

## 2020-12-02 ENCOUNTER — Other Ambulatory Visit: Payer: Self-pay | Admitting: Family Medicine

## 2020-12-02 DIAGNOSIS — E119 Type 2 diabetes mellitus without complications: Secondary | ICD-10-CM

## 2021-02-09 ENCOUNTER — Other Ambulatory Visit: Payer: Self-pay | Admitting: Family Medicine

## 2021-02-09 DIAGNOSIS — E119 Type 2 diabetes mellitus without complications: Secondary | ICD-10-CM

## 2021-02-09 DIAGNOSIS — I10 Essential (primary) hypertension: Secondary | ICD-10-CM

## 2021-04-13 ENCOUNTER — Other Ambulatory Visit: Payer: Self-pay | Admitting: Family Medicine

## 2021-04-13 DIAGNOSIS — E785 Hyperlipidemia, unspecified: Secondary | ICD-10-CM

## 2021-05-20 ENCOUNTER — Other Ambulatory Visit: Payer: Self-pay | Admitting: Family Medicine

## 2021-05-20 DIAGNOSIS — I10 Essential (primary) hypertension: Secondary | ICD-10-CM

## 2021-08-18 ENCOUNTER — Encounter: Payer: Self-pay | Admitting: Allergy and Immunology

## 2021-08-18 ENCOUNTER — Ambulatory Visit: Payer: 59 | Admitting: Allergy and Immunology

## 2021-08-18 VITALS — BP 146/92 | HR 74 | Resp 16 | Ht 66.0 in | Wt 192.8 lb

## 2021-08-18 DIAGNOSIS — Z79899 Other long term (current) drug therapy: Secondary | ICD-10-CM | POA: Diagnosis not present

## 2021-08-18 DIAGNOSIS — T7840XA Allergy, unspecified, initial encounter: Secondary | ICD-10-CM

## 2021-08-18 DIAGNOSIS — T7800XA Anaphylactic reaction due to unspecified food, initial encounter: Secondary | ICD-10-CM

## 2021-08-18 MED ORDER — LOSARTAN POTASSIUM 50 MG PO TABS: 50.0000 mg | ORAL_TABLET | Freq: Every day | ORAL | 5 refills | Status: DC
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
## 2021-08-18 NOTE — Patient Instructions (Addendum)
  1.  Allergen avoidance measures???  2.  EpiPen, Benadryl, MD/ER evaluation for allergic reaction  3.  Change ACE inhibitor (benazepril) to losartan 50 mg -1 tablet 1 time per day. Check BP.  4. Blood - sunflower seed IgE, pumpkin seed IgE, blueberry IgE, cinnamon IgE, nut IgE panel w/R, alpha-gal panel.   5. Food challenge???

## 2021-08-19 ENCOUNTER — Telehealth: Payer: Self-pay

## 2021-08-19 ENCOUNTER — Encounter: Payer: Self-pay | Admitting: Allergy and Immunology

## 2021-08-19 DIAGNOSIS — T7800XA Anaphylactic reaction due to unspecified food, initial encounter: Secondary | ICD-10-CM

## 2021-08-19 NOTE — Telephone Encounter (Signed)
Lab was added.

## 2021-08-19 NOTE — Telephone Encounter (Signed)
-----   Message from Jocelyn Nold Priest, MD sent at 08/19/2021  7:48 AM EDT ----- Can we add alpha gal panel to her blood tests requested yesterday?

## 2021-08-21 LAB — IGE NUT PROF. W/COMPONENT RFLX
F017-IgE Hazelnut (Filbert): <0.1 kU/L
F018-IgE Brazil Nut: <0.1 kU/L
F020-IgE Almond: <0.1 kU/L
F202-IgE Cashew Nut: <0.1 kU/L
F203-IgE Pistachio Nut: <0.1 kU/L
F256-IgE Walnut: <0.1 kU/L
Macadamia Nut, IgE: <0.1 kU/L
Peanut, IgE: <0.1 kU/L
Pecan Nut IgE: <0.1 kU/L

## 2021-08-21 LAB — ALLERGEN SUNFLOWER SEED K84: Sunflower Seed k84: <0.1 kU/L

## 2021-08-21 LAB — ALLERGEN, CINNAMON, RF220: Allergen Cinnamon IgE: <0.1 kU/L

## 2021-08-21 LAB — ALLERGEN, PUMPKIN (F225) IGE: Pumpkin IgE: <0.1 kU/L

## 2021-08-21 LAB — ALLERGEN, BLUEBERRY, RF288: Allergen Blueberry IgE: <0.1 kU/L

## 2022-02-06 ENCOUNTER — Other Ambulatory Visit: Payer: Self-pay | Admitting: Allergy and Immunology

## 2022-02-09 NOTE — Telephone Encounter (Signed)
Called and left a message for patient to inform her that the refill was sent and that she needed to ask that her PCP take over this medication as they will help keep track of her blood pressures.

## 2023-01-21 ENCOUNTER — Emergency Department (HOSPITAL_BASED_OUTPATIENT_CLINIC_OR_DEPARTMENT_OTHER): Payer: 59

## 2023-01-21 ENCOUNTER — Encounter (HOSPITAL_BASED_OUTPATIENT_CLINIC_OR_DEPARTMENT_OTHER): Payer: Self-pay

## 2023-01-21 ENCOUNTER — Other Ambulatory Visit: Payer: Self-pay

## 2023-01-21 ENCOUNTER — Emergency Department (HOSPITAL_BASED_OUTPATIENT_CLINIC_OR_DEPARTMENT_OTHER)
Admission: EM | Admit: 2023-01-21 | Discharge: 2023-01-21 | Disposition: A | Discharge status: Home / Self Care | Payer: 59 | Attending: Emergency Medicine | Admitting: Emergency Medicine

## 2023-01-21 DIAGNOSIS — Y990 Civilian activity done for income or pay: Secondary | ICD-10-CM | POA: Insufficient documentation

## 2023-01-21 DIAGNOSIS — I10 Essential (primary) hypertension: Secondary | ICD-10-CM | POA: Diagnosis not present

## 2023-01-21 DIAGNOSIS — S5011XA Contusion of right forearm, initial encounter: Secondary | ICD-10-CM | POA: Diagnosis not present

## 2023-01-21 DIAGNOSIS — E119 Type 2 diabetes mellitus without complications: Secondary | ICD-10-CM | POA: Insufficient documentation

## 2023-01-21 DIAGNOSIS — S6991XA Unspecified injury of right wrist, hand and finger(s), initial encounter: Secondary | ICD-10-CM | POA: Diagnosis present

## 2023-01-21 DIAGNOSIS — S63501A Unspecified sprain of right wrist, initial encounter: Secondary | ICD-10-CM | POA: Insufficient documentation

## 2023-01-21 DIAGNOSIS — S40021A Contusion of right upper arm, initial encounter: Secondary | ICD-10-CM

## 2023-01-21 MED ORDER — IBUPROFEN 800 MG PO TABS
800.0000 mg | ORAL_TABLET | Freq: Once | ORAL | Status: AC
  Administered 2023-01-21: 800 mg via ORAL
  Filled 2023-01-21: qty 1

## 2023-01-21 MED ORDER — ACETAMINOPHEN 325 MG PO TABS
650.0000 mg | ORAL_TABLET | Freq: Once | ORAL | Status: AC
  Administered 2023-01-21: 650 mg via ORAL
  Filled 2023-01-21: qty 2
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
## 2023-01-21 NOTE — Discharge Instructions (Signed)
Please use Tylenol or ibuprofen for pain.  You may use 600 mg ibuprofen every 6 hours or 1000 mg of Tylenol every 6 hours.  You may choose to alternate between the 2.  This would be most effective.  Not to exceed 4 g of Tylenol within 24 hours.  Not to exceed 3200 mg ibuprofen 24 hours.  I would recommend using the wrist brace as needed for any pain, instability of the right wrist for at least 1 week, if you are still having significant pain after 1 to 2 weeks recommend that you follow-up with orthopedic physician's contact formation I provided above.  For the first few days the injury is likely to benefit from rest, ice, compression, and elevation.
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
## 2024-02-04 ENCOUNTER — Other Ambulatory Visit: Payer: Self-pay | Admitting: Medical Genetics
